# Patient Record
Sex: Female | Born: 1975
Health system: Western US, Academic
[De-identification: ages and names within clinical notes are randomized; demographics above are authoritative.]

---

## 2015-04-10 DIAGNOSIS — Z01419 Encounter for gynecological examination (general) (routine) without abnormal findings: Secondary | ICD-10-CM

## 2017-10-09 ENCOUNTER — Ambulatory Visit: Payer: PRIVATE HEALTH INSURANCE

## 2017-10-09 DIAGNOSIS — Z01419 Encounter for gynecological examination (general) (routine) without abnormal findings: Secondary | ICD-10-CM

## 2017-10-09 NOTE — Progress Notes
[facility] OB/[facility]N  New Gynecology/Annual Visit     PATIENT: Julie Armstrong  MRN: [id number]  DOB: November 24, 1976  DATE OF SERVICE: 10/09/2017    REFERRING PRACTITIONER: Harrietta Guardian., MD  PRIMARY CARE PROVIDER: Glendell Docker, MD    Chief Complaint:   Chief Complaint   Patient presents with   ??? Annual Exam     Follow up. Mammo due.       Subjective:      Julie Armstrong is a 41 y.o. [id number] who presents for well woman exam.  She is doing well and without complaints.     Review of Systems:  A 14-system review of systems was performed and is negative except as stated in the history of present illness.     Past Gynecologic History:  Menses qmonth  LPS   Results for orders placed or performed in visit on 04/10/15   Pap Smear   Result Value Ref Range    INTERPRETATION-RESULT Negative for Intraepithelial Lesion or Malignancy      SPECIMEN ADEQUACY       Satisfactory for evaluation, endocervical/transformation zone component present    Signatures       I certify that I personally conducted a gross and/or microscopic examination(s) of the described specimen(s), and have reviewed the interpretation of this test and diagnosis(es).  I agree with the documented findings or edited the findings as necessary.      CASE REPORT       Gynecologic Cytology Report                       Case: [id number]                                Authorizing Provider:  Harrietta Guardian., MD         Collected:           04/10/2015 1613              Ordering Location:     [facility]        Received:            04/11/2015 1540                                     [facility]                                                              First Screen:          Chryl Heck, CT(ASCP)                                                       Rescreen:              Phineas Semen, SCT(ASCP)  Specimen:    Cy Exo/Endo Smear Thin Prep-Screen, Exo/Endocervix HPV DNA PCR,Genital   Result Value Ref Range    HPV Type 16 Negative Negative for High Risk HPV DNA    HPV Type 18 Negative Negative for High Risk HPV DNA    Other High Risk HPV Negative Negative for High Risk HPV DNA    Narrative    Other High Risk HPV include: [id number] and 68.  Not tested for Low Risk HPV DNA.   Pap smear 30+ yo (with HPV), Thinprep    Narrative    The following orders were created for panel order Pap smear 30+ yo (with HPV), Thinprep.  Procedure                               Abnormality         Status                     ---------                               -----------         ------                     HPV DNA PCR,Genital[[id number]]          Normal              Final result               Pap [id number]]                                        Final result               Pap [id number]]                                        Final result                 Please view results for these tests on the individual orders.      No history of abnormal pap smear  No history of STD  Sexually active?: YES  Current Birth Control: fertility awareness    Past Obstetrical History:  [id number]    Past Medical History: No past medical history on file.  Past Surgical History:   Past Surgical History:   Procedure Laterality Date   ??? HIP SURGERY Right    ??? wisdom teeth         Medications:   No current outpatient prescriptions on file.     No current facility-administered medications for this visit.      Allergies: No Known Allergies    Social History:  Social History   Substance Use Topics   ??? Smoking status: Never Smoker   ??? Smokeless tobacco: Never Used   ??? Alcohol use No       Family History:   Family History   Problem Relation Age of Onset   ??? Hypertension Paternal Grandfather    ??? Hypertension Paternal Grandmother    ??? Diabetes Maternal Grandmother    ??? Hypertension Father    ??? Breast cancer Neg Hx    ???  Colon cancer Neg Hx          Objective:     Vitals:    10/09/17 0824 BP: (!) 154/101   Pulse: 75   Weight: 118 lb 3.2 oz (53.6 kg)   Height: 5' 4'' (1.626 m)     Body mass index is 20.29 kg/m???.    Gen: NAD  Neck: symmetrical, no masses, no thyroid enlargement  Breast: symmetrical soft, NT, no masses, no discharge  Lymph: no axillary lymphadenopathy, no neck lymphadenopathy  GI: soft, NT, ND, no evidence of hernia  Pelvic:   EGBUS: normal anatomy, no lesions, normal hair pattern  Urethral meatus: no masses, no tenderness, no prolapase  Bladder: no fullness, no suprapubic tenderness  Vagina: no discharge no lesions, normal estrogen effect  No signs of cystocele or rectocele  Cervix: no lesions, no active discharge, no CMT  BME: 8 weeks NT mobile no adnexal masses no adnexal tenderness  No groin lymphadenopathy  Perineum/anus: no hemorrhoids, no abrasions  Extremities: no edema no cords  Skin: no rashes no ulcers  Neuro: normal gait, A&O x 3  Psych: normal affect pleasant demeanor    Lab Review:    No visits with results within 2 Month(s) from this visit.   Latest known visit with results is:   Office Visit on 04/10/2015   Component Date Value   ??? HPV Type 16 04/10/2015 Negative    ??? HPV Type 18 04/10/2015 Negative    ??? Other High Risk HPV 04/10/2015 Negative    ??? INTERPRETATION-RESULT 04/10/2015 Negative for Intraepithelial Lesion or Malignancy    ??? SPECIMEN ADEQUACY 04/10/2015 Satisfactory for evaluation, endocervical/transformation zone component present    ??? Signatures 04/10/2015                      Value:This result contains rich text formatting which cannot be displayed here.   ??? CASE REPORT 04/10/2015                      Value:Gynecologic Cytology Report                       Case: [id number]                                Authorizing Provider:  Harrietta Guardian., MD         Collected:           04/10/2015 1613              Ordering Location:     [facility]        Received:            04/11/2015 1540 [facility]                                                              First Screen:          Chryl Heck, CT(ASCP)  Rescreen:              Phineas Semen, SCT(ASCP)                                                   Specimen:    Cy Exo/Endo Smear Thin Prep-Screen, Exo/Endocervix                                              Assessment:     41 y.o. [id number] presents for well woman exam    Plan/ Recommendation:   Problem List:  1. Well Woman Exam  - pap smear completed  - new pap smear guidelines have been discussed with the patient  - Mammogram ordered  2. STD counseling  - declined full STD screening  3. Contraception  - declined other options    Follow up in one year or prn    Author:  Maceo Pro. Shannan Harper 10/09/2017 8:33 AM

## 2017-10-12 LAB — HPV DNA PCR: HPV TYPE 18: NEGATIVE

## 2017-10-17 LAB — Liquid-based pap smear

## 2017-12-10 ENCOUNTER — Ambulatory Visit: Payer: PRIVATE HEALTH INSURANCE

## 2019-10-03 ENCOUNTER — Ambulatory Visit: Payer: PRIVATE HEALTH INSURANCE

## 2019-10-03 DIAGNOSIS — Z01419 Encounter for gynecological examination (general) (routine) without abnormal findings: Secondary | ICD-10-CM

## 2019-10-03 NOTE — Addendum Note
Addended by: Jerre Simon NEFTALI on: 10/03/2019 03:29 PM     Modules accepted: Orders

## 2019-10-03 NOTE — Progress Notes
[facility]  Gynecology/Annual Visit     PATIENT: Julie Armstrong  MRN: [id number]  DOB: 1976/04/24  DATE OF SERVICE: 10/03/2019    REFERRING PRACTITIONER: Harrietta Guardian., MD  PRIMARY CARE PROVIDER: Lovena Neighbours., MD    Chief Complaint:   Chief Complaint   Patient presents with   ? Annual Exam       Subjective:      Julie Armstrong is a 43 y.o. [id number] who presents for well woman exam.  She is doing well and without complaints.     Review of Systems:  A 14-system review of systems was performed and is negative except as stated in the history of present illness.     Past Gynecologic History:  Menses qmonth  LPS   Results for orders placed or performed in visit on 10/09/17   Pap Smear   Result Value Ref Range    CASE REPORT       Gynecologic Cytology Report                       Case: [id number]                                Authorizing Provider:  Harrietta Guardian., MD         Collected:           10/09/2017 1006              Ordering Location:     [facility]        Received:            10/12/2017 0925                                     [facility]                                                                First Screen:          Dory Peru, [place]                                                              Specimen:    Cy Exo/Endo Smear Sure Path-Screen, Exo/Endocervix                                         LMP 10/09/2017     INTERPRETATION-RESULT Negative for Intraepithelial Lesion or Malignancy      SPECIMEN ADEQUACY       Satisfactory for evaluation  Endocervical cells present    Signatures       I certify that I personally conducted a gross and/or microscopic examination(s) of the described specimen(s), and have reviewed the interpretation of this test and diagnosis(es).  I agree with the documented findings or edited the findings as necessary.  HPV DNA PCR,Genital   Result Value Ref Range    HPV Type 16 Negative Negative for High Risk HPV DNA HPV Type 18 Negative Negative for High Risk HPV DNA    Other High Risk HPV Negative Negative for High Risk HPV DNA    Narrative    Other High Risk HPV include: [phone] and 68.  Not tested for Low Risk HPV DNA.   Pap smear 30+ yo (with HPV), Surepath    Narrative    The following orders were created for panel order Pap smear 30+ yo (with HPV), Surepath.  Procedure                               Abnormality         Status                     ---------                               -----------         ------                     HPV DNA PCR,Genital[[id number]]          Normal              Final result               Pap [id number]]                                        Final result               Pap [id number]]                                        Final result                 Please view results for these tests on the individual orders.      No history of abnormal pap smear  No history of STD  Sexually active?: YES  Current Birth Control: fertility awareness    Past Obstetrical History:  [id number]    Past Medical History: No past medical history on file.  Past Surgical History:   Past Surgical History:   Procedure Laterality Date   ? HIP SURGERY Right    ? wisdom teeth         Medications:   No current outpatient medications on file.     No current facility-administered medications for this visit.      Allergies: No Known Allergies    Social History:  Social History     Tobacco Use   ? Smoking status: Never Smoker   ? Smokeless tobacco: Never Used   Substance Use Topics   ? Alcohol use: No     Alcohol/week: 0.0 oz   ? Drug use: No       Family History:   Family History   Problem Relation Age of Onset   ? Hypertension Paternal Grandfather    ? Hypertension Paternal Grandmother    ? Diabetes Maternal Grandmother    ? Hypertension Father    ?  Breast cancer Neg Hx    ? Colon cancer Neg Hx          Objective:     Vitals:    10/03/19 1452   BP: 131/72   Pulse: 86   Temp: 36 ?C (96.8 ?F) TempSrc: Temporal   Weight: 117 lb (53.1 kg)   Height: 5' 4'' (1.626 m)     Body mass index is 20.08 kg/m?.    Gen: NAD  Neck: symmetrical, no masses, no thyroid enlargement  Breast: symmetrical soft, NT, no masses, no discharge  Lymph: no axillary lymphadenopathy, no neck lymphadenopathy  GI: soft, NT, ND, no evidence of hernia  Pelvic:   EGBUS: normal anatomy, no lesions, normal hair pattern  Urethral meatus: no masses, no tenderness, no prolapase  Bladder: no fullness, no suprapubic tenderness  Vagina: no discharge no lesions, normal estrogen effect  No signs of cystocele or rectocele  Cervix: no lesions, no active discharge, no CMT  BME: 8 weeks NT mobile no adnexal masses no adnexal tenderness  No groin lymphadenopathy  Perineum/anus: no hemorrhoids, no abrasions  Extremities: no edema no cords  Skin: no rashes no ulcers  Neuro: normal gait, A&O x 3  Psych: normal affect pleasant demeanor    Lab Review:    No visits with results within 2 Month(s) from this visit.   Latest known visit with results is:   Office Visit on 10/09/2017   Component Date Value   ? HPV Type 16 10/09/2017 Negative    ? HPV Type 18 10/09/2017 Negative    ? Other High Risk HPV 10/09/2017 Negative    ? CASE REPORT 10/09/2017                      Value:Gynecologic Cytology Report                       Case: [id number]                                Authorizing Provider:  Harrietta Guardian., MD         Collected:           10/09/2017 1006              Ordering Location:     [facility]       Received:            10/12/2017 0925                                     [facility]                                                                First Screen:          Dory Peru, [place]  Specimen:    Cy Exo/Endo Smear Sure Path-Screen, Exo/Endocervix                                        ? LMP 10/09/2017 10/09/2017 ? INTERPRETATION-RESULT 10/09/2017 Negative for Intraepithelial Lesion or Malignancy    ? SPECIMEN ADEQUACY 10/09/2017 Satisfactory for evaluation  Endocervical cells present    ? Signatures 10/09/2017                      Value:This result contains rich text formatting which cannot be displayed here.         Assessment:     43 y.o. [id number] presents for well woman exam    Plan/ Recommendation:   Problem List:  1. Well Woman Exam  - pap smear completed  - new pap smear guidelines have been discussed with the patient  - Mammogram ordered  2. STD counseling  - declined full STD screening  3. Contraception  - declined other options    Follow up in one year or prn    Author:  Maceo Pro. Gudelia Eugene 10/03/2019 3:06 PM

## 2019-10-06 LAB — HPV DNA PCR: OTHER HIGH RISK HPV: NEGATIVE

## 2019-10-14 LAB — Liquid-based pap smear

## 2019-11-17 ENCOUNTER — Ambulatory Visit: Payer: PRIVATE HEALTH INSURANCE

## 2022-01-06 ENCOUNTER — Telehealth: Payer: PRIVATE HEALTH INSURANCE

## 2022-01-06 DIAGNOSIS — Z1231 Encounter for screening mammogram for malignant neoplasm of breast: Secondary | ICD-10-CM

## 2022-01-06 NOTE — Telephone Encounter
Orders faxed

## 2022-01-06 NOTE — Telephone Encounter
Orders Request    What is being requested? (Tests, Labs, Imaging, etc.): Mammo screening order    Reason for the request: annual. Please also send to patient on mychart.     Where does the patient want to be seen?    [facility]   If outside [facility], what is the fax number to the facility?  Fax:[phone]    Has the patient seen their doctor for this matter?   yes  Last office visit: 10/03/19    Patient or caller was offered an appointment but declined.    Patient or caller has been notified of the turnaround time of 1-2 business day(s).

## 2022-02-21 ENCOUNTER — Telehealth: Payer: PRIVATE HEALTH INSURANCE

## 2022-02-21 NOTE — Telephone Encounter
Pt informed. Will watch for the month and let us know if persists.

## 2022-02-21 NOTE — Telephone Encounter
Call Back Request      Reason for call back:     Pt noticed a small lump on right breast. Mammogram was done 01/14/22, normal results. Pt is on her 4th day of her cycle. Pls call back to advised. If no answer pls leave a msg     Any Symptoms:  [x]  Yes  []  No       If yes, what symptoms are you experiencing:  Small right breast lump   o Duration of symptoms (how long):  4 day   o Have you taken medication for symptoms (OTC or Rx):  no    If call was taken outside of clinic hours:    [] Patient or caller has been notified that this message was sent outside of normal clinic hours.     [] Patient or caller has been warm transferred to the physician's answering service. If applicable, patient or caller informed to please call us back if symptoms progress.  Patient or caller has been notified of the turnaround time of 1-2 business day(s).

## 2022-07-22 ENCOUNTER — Ambulatory Visit: Payer: PRIVATE HEALTH INSURANCE

## 2022-07-22 NOTE — Progress Notes
[facility] OB/GYN  Gynecology/Annual Visit     PATIENT: Julie Armstrong  MRN: [id number]  DOB: Dec 21, 1975  DATE OF SERVICE: 07/22/2022    REFERRING PRACTITIONER: Referral, Self  PRIMARY CARE PROVIDER: Diona Foley, MD    Chief Complaint:   Chief Complaint   Patient presents with   ? Annual Exam       Subjective:      Julie Armstrong is a 46 y.o. [id number] who presents for well woman exam.  Having insomnia and slight changes in menses.  Density D on mammogram.  She is doing well and without complaints.     Review of Systems:  A 14-system review of systems was performed and is negative except as stated in the history of present illness.     Past Gynecologic History:  Menses qmonth  LPS   Results for orders placed or performed in visit on 10/03/19   Pap Smear   Result Value Ref Range    CASE REPORT       Gynecologic Cytology Report                       Case: [id number]                                Authorizing Provider:  Harrietta Guardian., MD         Collected:           10/03/2019 1529              Ordering Location:     [facility]       Received:            10/04/2019 1347                                     [facility]                                                                First Screen:          Janifer Adie, CT(ASCP)                                                         Specimen:    Cy Exo/Endo Smear Sure Path-Screen, Exo/Endocervix                                         LMP 09/28/2019     INTERPRETATION-RESULT Negative for Intraepithelial Lesion or Malignancy.      SPECIMEN ADEQUACY       Satisfactory for evaluation.  Endocervical cells present.    Signatures       I certify that I personally conducted a gross and/or microscopic examination(s) of the described specimen(s), and have reviewed the interpretation of this test and diagnosis(es).  I agree with the documented  findings or edited the findings as necessary.     HPV DNA PCR,Genital    Specimen: Exo/Endocervix; Genital Cytology Result Value Ref Range    HPV Type 16 Negative Negative for High Risk HPV DNA    HPV Type 18 Negative Negative for High Risk HPV DNA    Other High Risk HPV Negative Negative for High Risk HPV DNA    Narrative    Other High Risk HPV include: [phone] and 68.  Not tested for Low Risk HPV DNA.   Pap smear 30+ yo (with HPV), [facility]    Narrative    The following orders were created for panel order Pap smear 30+ yo (with HPV), [facility].  Procedure                               Abnormality         Status                     ---------                               -----------         ------                     HPV DNA PCR,Genital[[id number]]          Normal              Final result               Pap [id number]]                                        Final result               Pap [id number][[id number]]                                        Final result                 Please view results for these tests on the individual orders.      No history of abnormal pap smear  No history of STD  Sexually active?: YES  Current Birth Control: fertility awareness    Past Obstetrical History:  [id number]    Past Medical History: No past medical history on file.  Past Surgical History:   Past Surgical History:   Procedure Laterality Date   ? HIP SURGERY Right    ? wisdom teeth         Medications:   No current outpatient medications on file.     No current facility-administered medications for this visit.     Allergies:   Allergies   Allergen Reactions   ? Penicillins Nausea And Vomiting       Social History:  Social History     Tobacco Use   ? Smoking status: Never   ? Smokeless tobacco: Never   Substance Use Topics   ? Alcohol use: No     Alcohol/week: 0.0 oz   ? Drug use: No       Family History:   Family History   Problem Relation Age of  Onset   ? Hypertension Father    ? Cancer Father         prostate   ? Hypertension Paternal Grandfather    ? Hypertension Paternal Grandmother    ? Diabetes Maternal Grandmother    ? Breast cancer Neg Hx    ? Colon cancer Neg Hx          Objective:     Vitals:    07/22/22 0840   BP: 115/66   Pulse: 83   Weight: 122 lb (55.3 kg)   Height: 5' 4'' (1.626 m)     Body mass index is 20.94 kg/m?.    Gen: NAD  Neck: symmetrical, no masses, no thyroid enlargement  Breast: symmetrical soft, NT, no masses, no discharge  Lymph: no axillary lymphadenopathy, no neck lymphadenopathy  GI: soft, NT, ND, no evidence of hernia  Pelvic:   EGBUS: normal anatomy, no lesions, normal hair pattern  Urethral meatus: no masses, no tenderness, no prolapase  Bladder: no fullness, no suprapubic tenderness  Vagina: no discharge no lesions, normal estrogen effect  No signs of cystocele or rectocele  Cervix: no lesions, no active discharge, no CMT  BME: 8 weeks NT mobile no adnexal masses no adnexal tenderness  No groin lymphadenopathy  Perineum/anus: no hemorrhoids, no abrasions  Extremities: no edema no cords  Skin: no rashes no ulcers  Neuro: normal gait, A&O x 3  Psych: normal affect pleasant demeanor    Lab Review:    No visits with results within 2 Month(s) from this visit.   Latest known visit with results is:   Office Visit on 10/03/2019   Component Date Value   ? HPV Type 16 10/03/2019 Negative    ? HPV Type 18 10/03/2019 Negative    ? Other High Risk HPV 10/03/2019 Negative    ? CASE REPORT 10/03/2019                      Value:Gynecologic Cytology Report                       Case: [id number]                                Authorizing Provider:  Harrietta Guardian., MD         Collected:           10/03/2019 1529              Ordering Location:     [facility]       Received:            10/04/2019 1347                                     [facility]                                                                First Screen:          Janifer Adie, CT(ASCP)  Specimen:    Cy Exo/Endo Smear Sure Path-Screen, Exo/Endocervix ? LMP 10/03/2019 09/28/2019    ? INTERPRETATION-RESULT 10/03/2019 Negative for Intraepithelial Lesion or Malignancy.    ? SPECIMEN ADEQUACY 10/03/2019 Satisfactory for evaluation.  Endocervical cells present.    ? Signatures 10/03/2019                      Value:This result contains rich text formatting which cannot be displayed here.         Assessment:     46 y.o. [id number] presents for well woman exam    Plan/ Recommendation:   Problem List:  1. Well Woman Exam  - pap smear completed  - new pap smear guidelines have been discussed with the patient  - Mammogram ordered  2. STD counseling  - declined full STD screening  3. Contraception  - declined other options    Follow up in one year or prn    Author:  Maceo Pro. Tessi Eustache 07/22/2022 8:45 AM

## 2022-07-24 LAB — HPV DNA PCR: HPV TYPE 16: NEGATIVE

## 2022-08-02 LAB — Liquid-based pap smear

## 2022-08-25 IMAGING — CR calc dir
2 series · 2 of 2 positions shown · non-contrast
Comparison: none

[axial (1 of 2)]
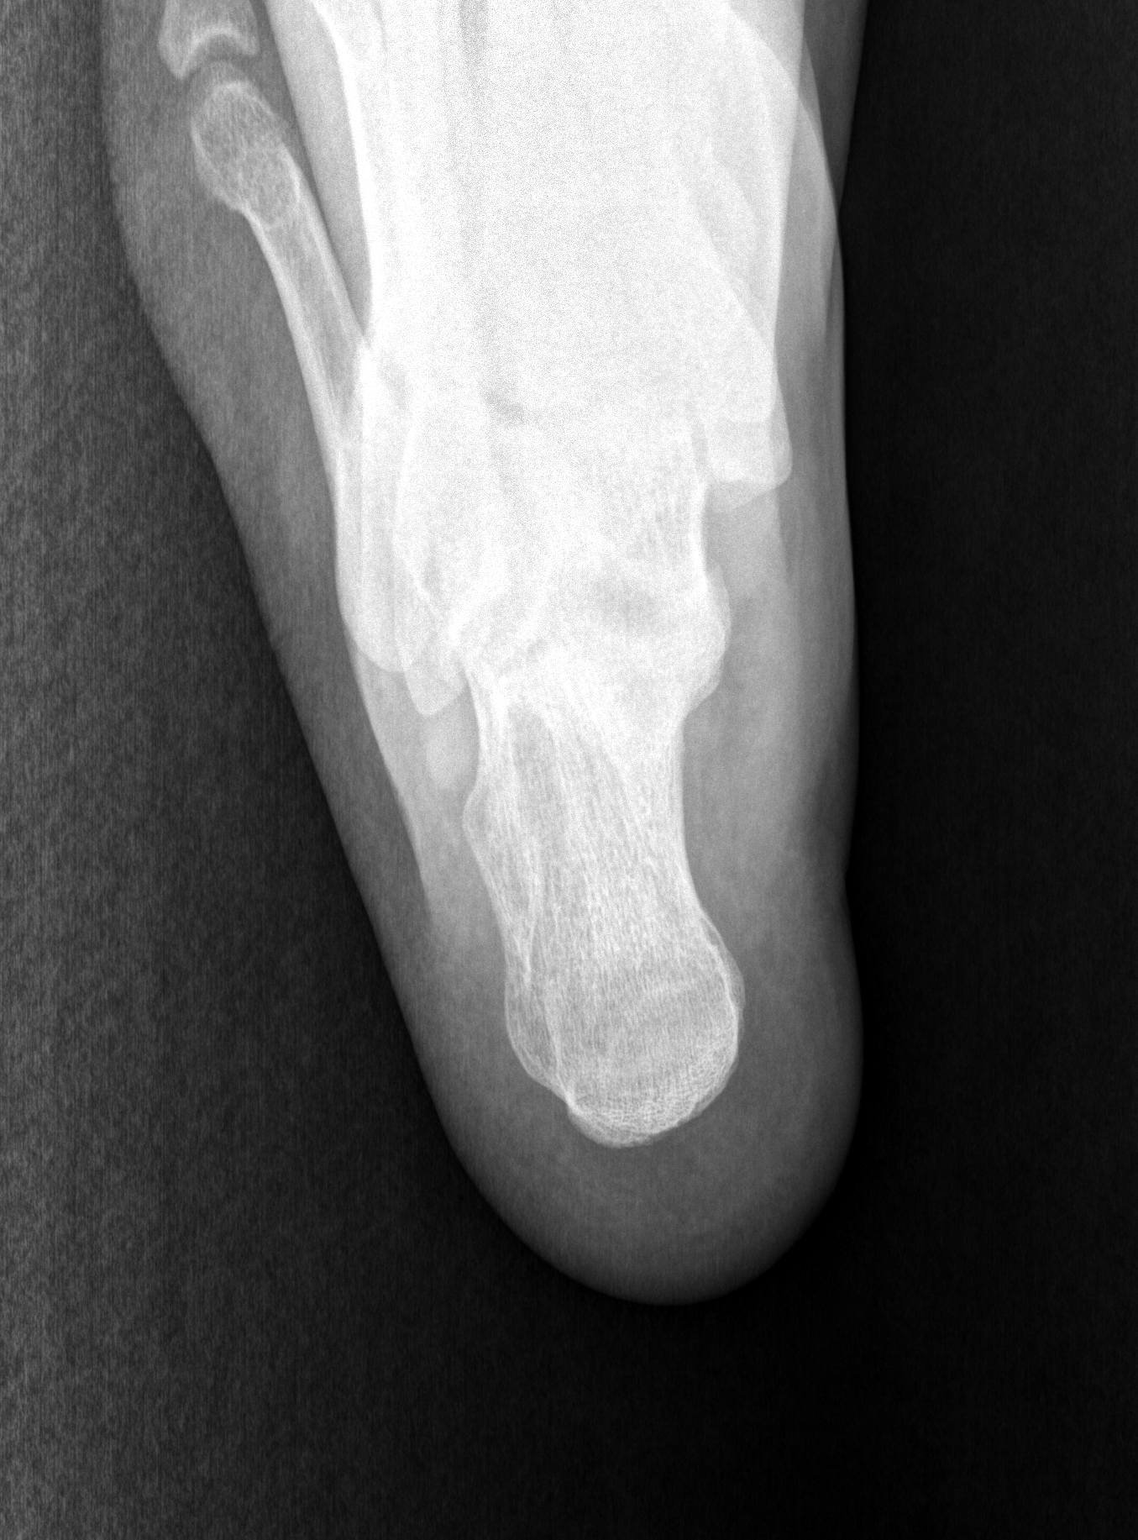

[axial (2 of 2)]
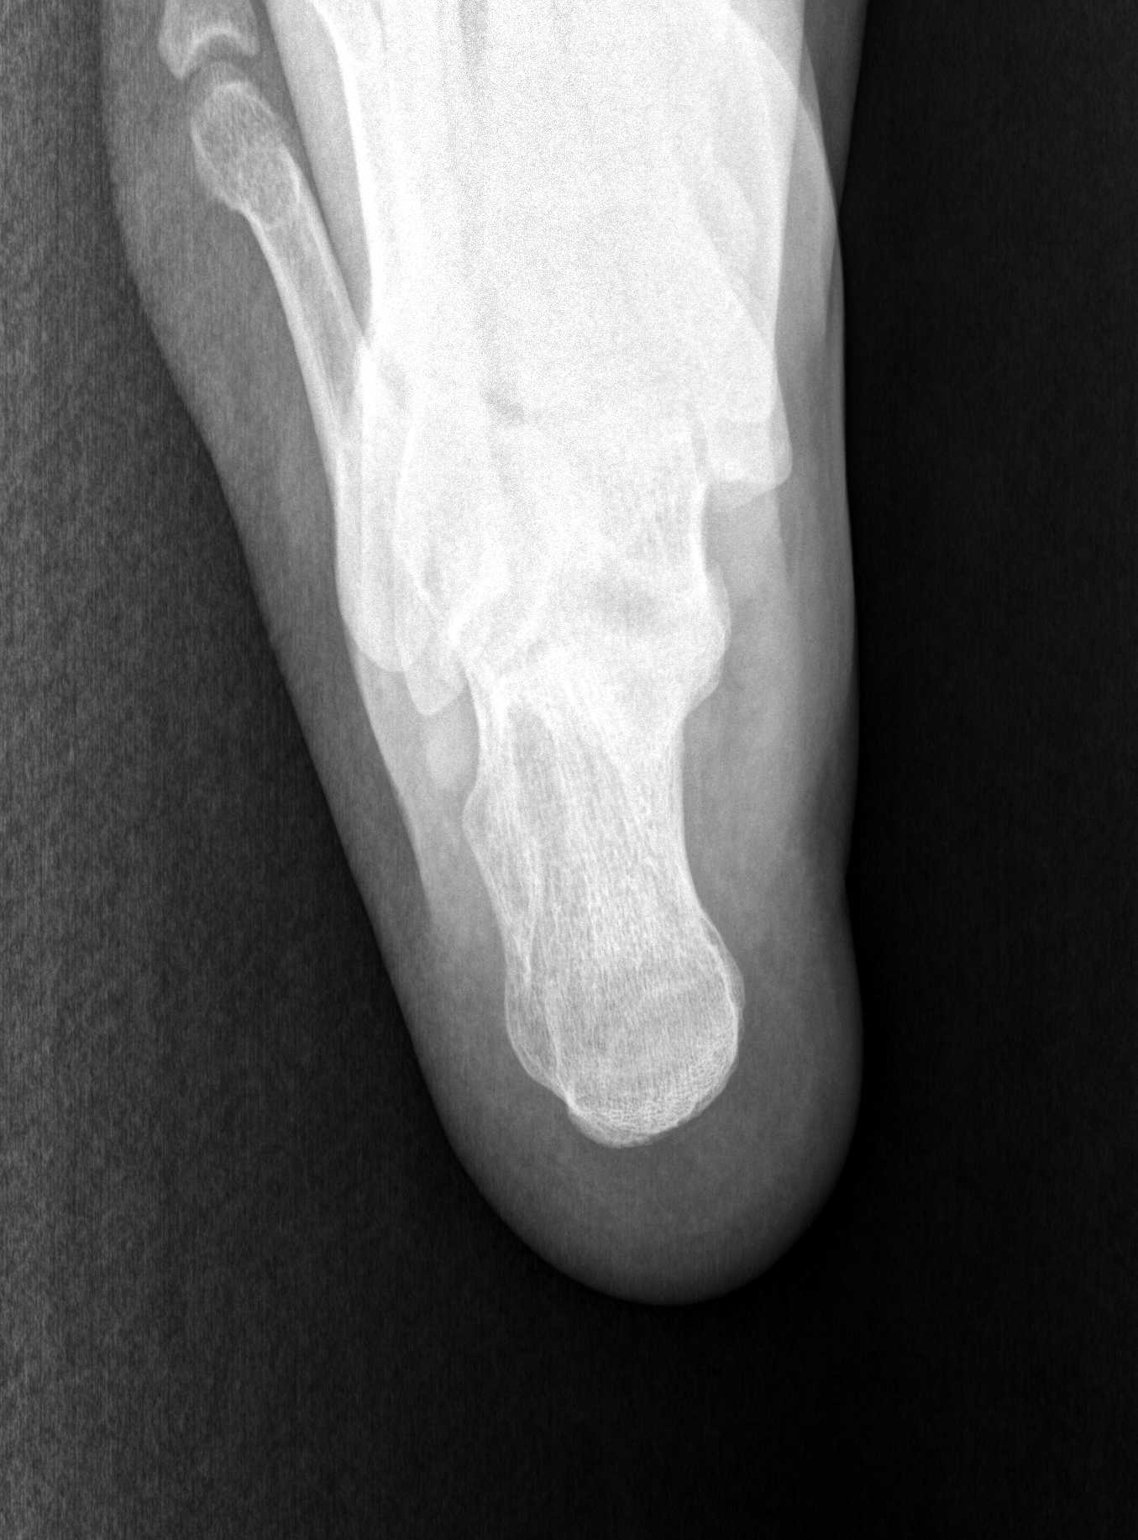

[2 of 2 positions shown; findings below may reference images not displayed]

Relatório:
Textura óssea normal.
Estruturas ósseas alinhadas.
RADIOGRAFIA DIGITAL DO CALCÂNEO DIREITO AXIAL /
Partes moles visíveis sem alterações.

## 2022-08-25 IMAGING — CR pe dir
6 series · 6 of 6 positions shown · non-contrast
Comparison: none

[ap (1 of 2)]
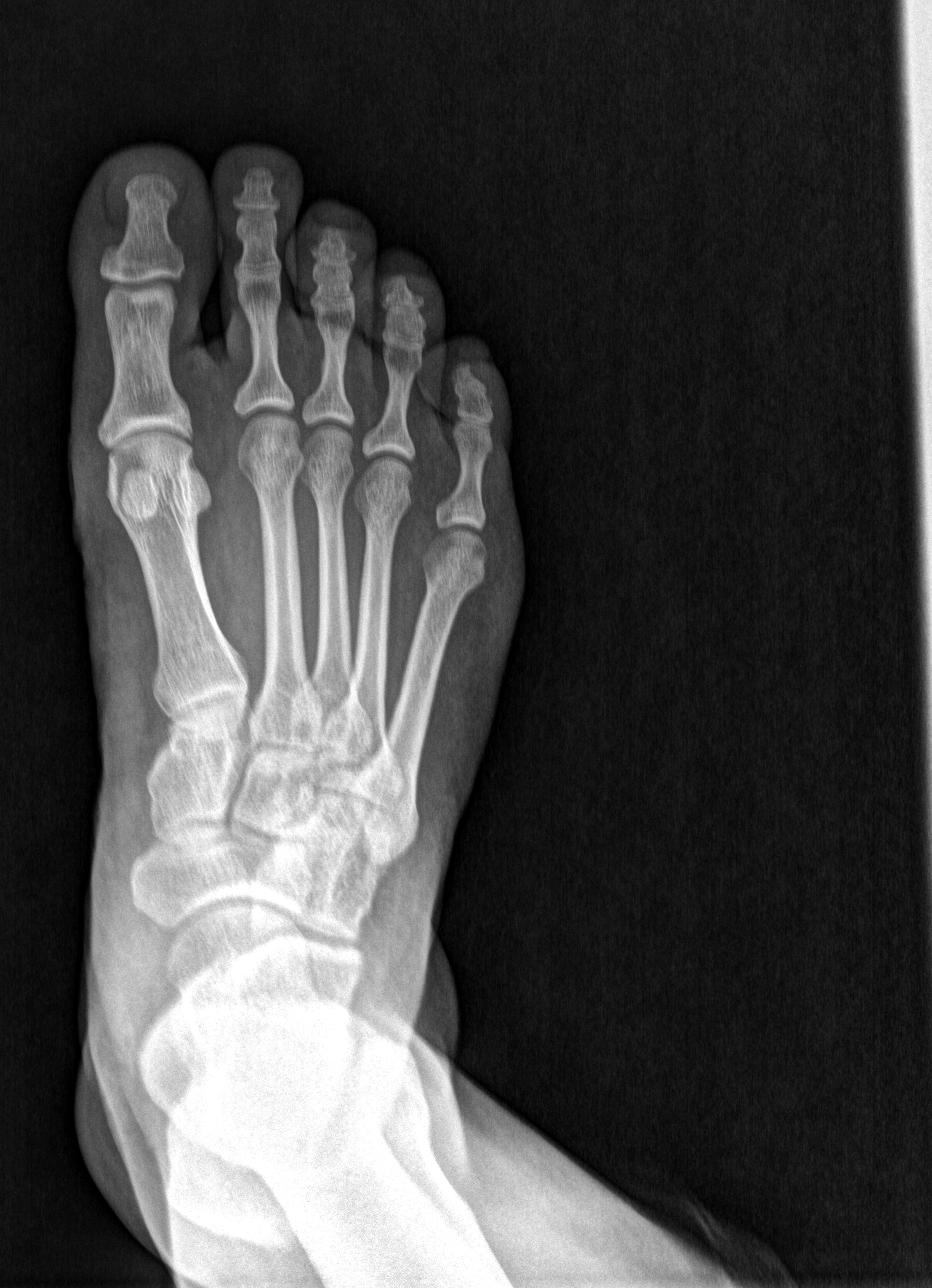

[oblíqua (1 of 2)]
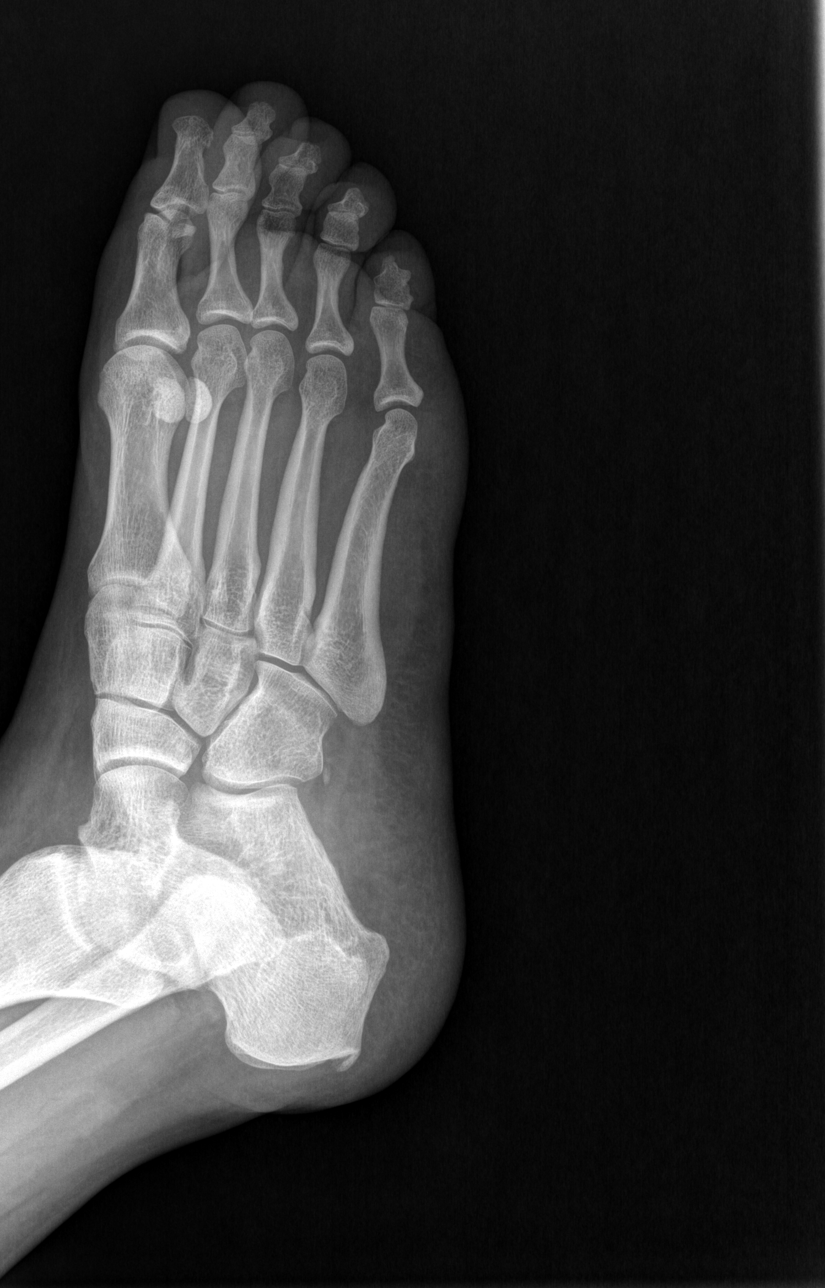

[lateral (1 of 2)]
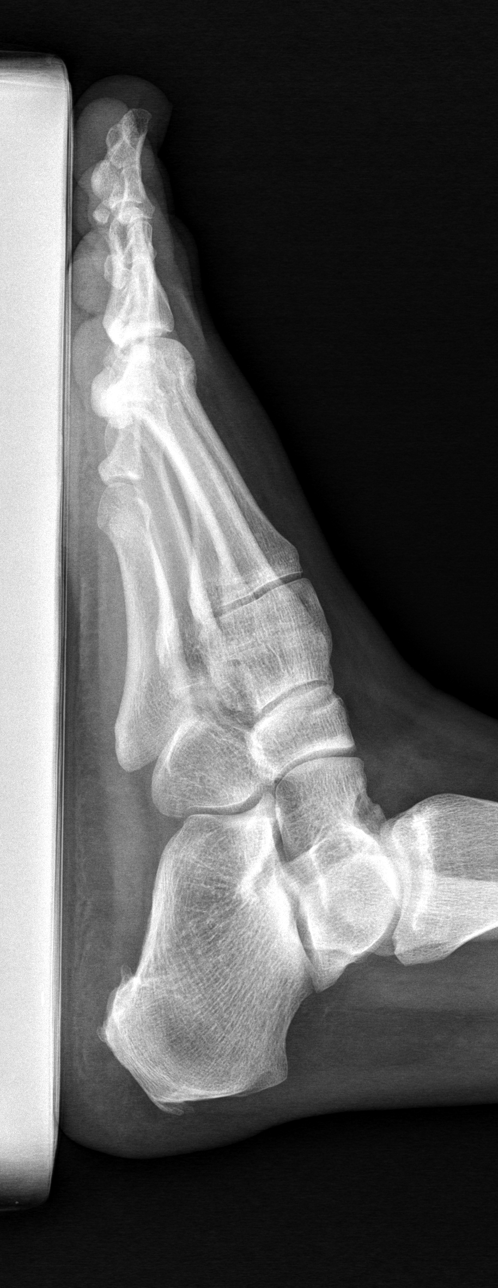

[ap (2 of 2)]
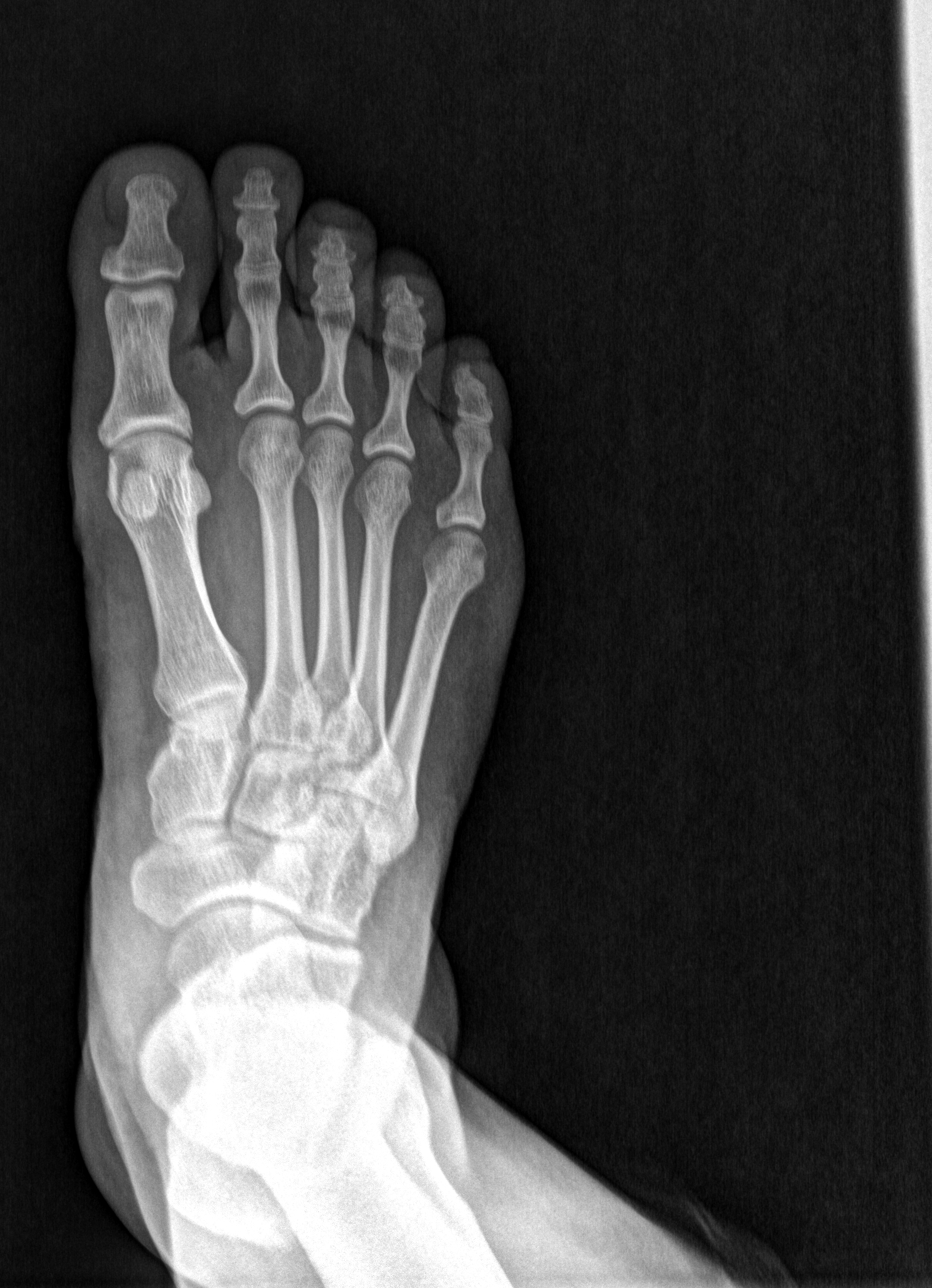

[oblíqua (2 of 2)]
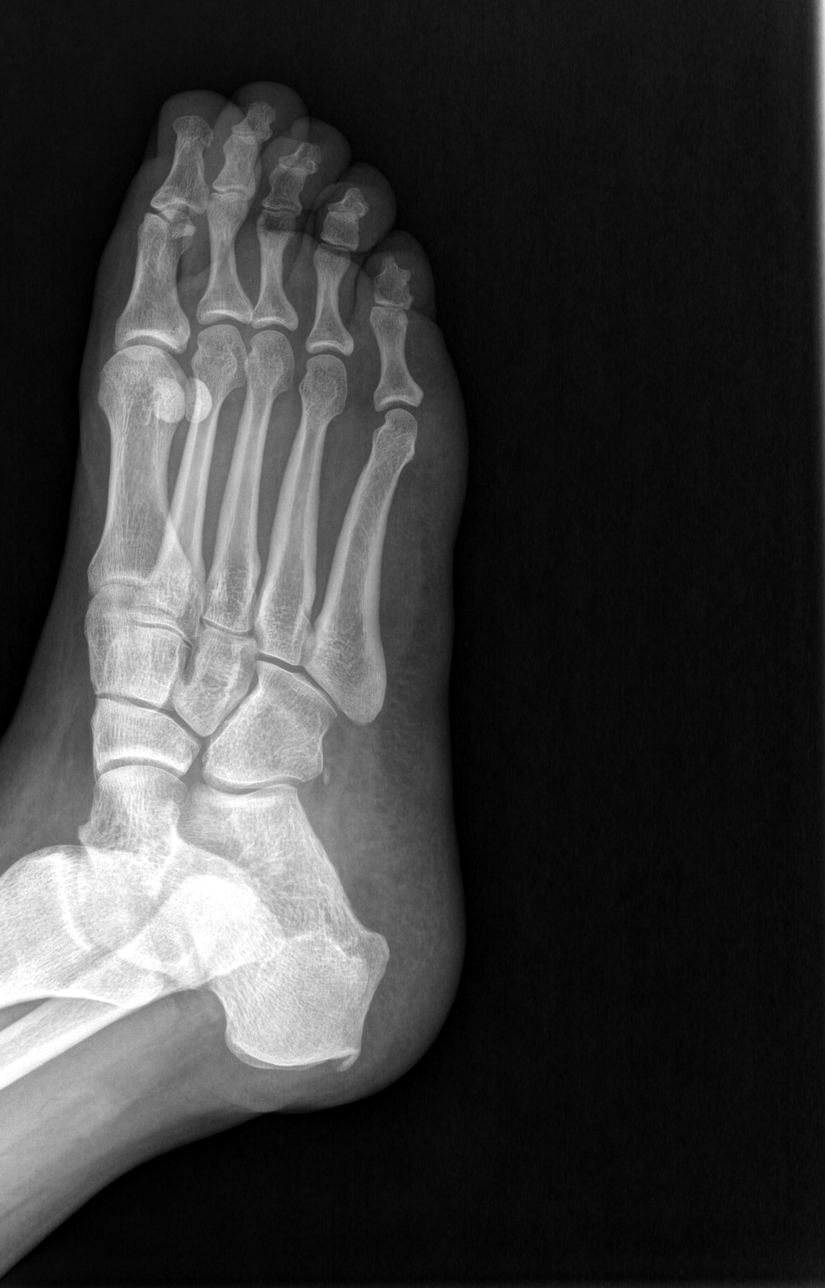

[lateral (2 of 2)]
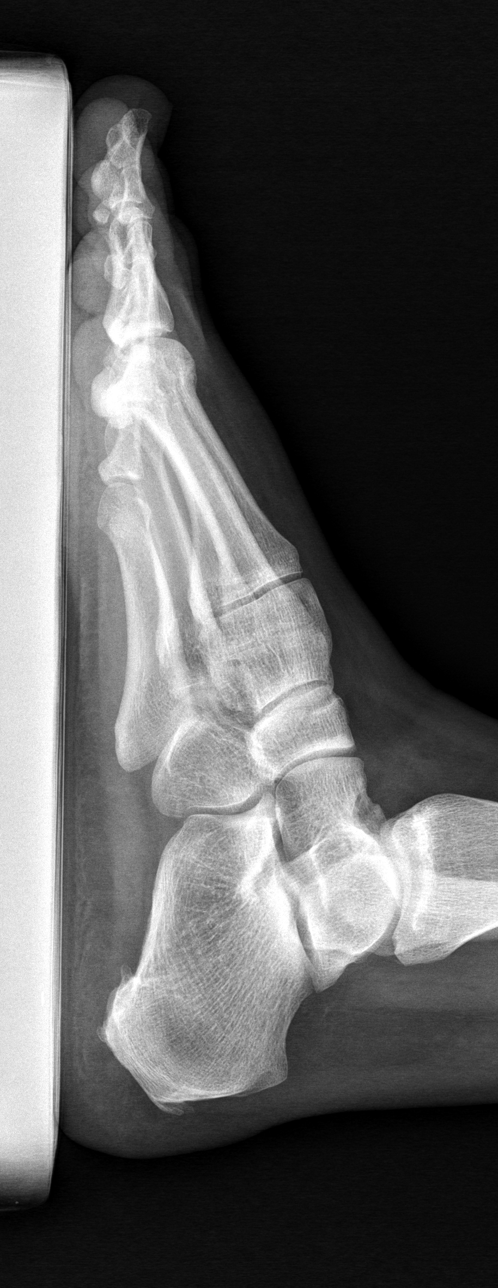

[6 of 6 positions shown; findings below may reference images not displayed]

RADIOGRAFIA DIGITAL     PÉ DIREITO    AP E PERFIL COM APOIO / OBLIQUA
Morfologia óssea normal.
Espaços articulares conservados.
Estruturas ósseas alinhadas.
"Esporões" do calcâneo Dir/

## 2022-08-25 IMAGING — MR [HOSPITAL]^TORNOZELO
6 series · 40 of 40 positions shown · non-contrast
Comparison: none

[Series 3: T1 · sagittal · left · 3.5mm · 0.44mm/px · 6 of 17 slices shown]
[im 1/17]
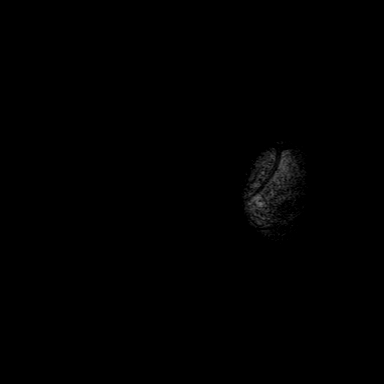
[im 4/17]
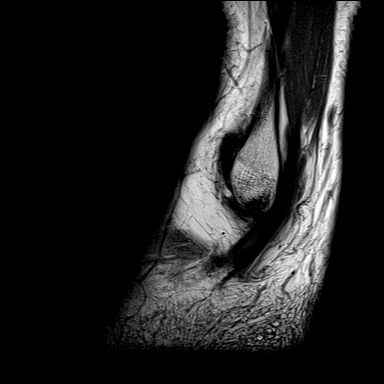
[im 7/17]
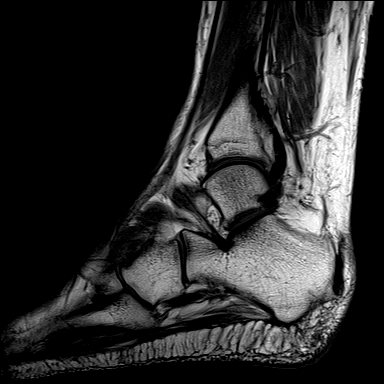
[im 10/17]
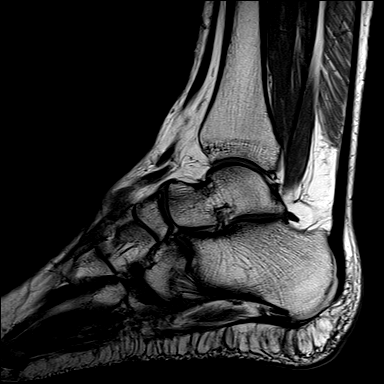
[im 13/17]
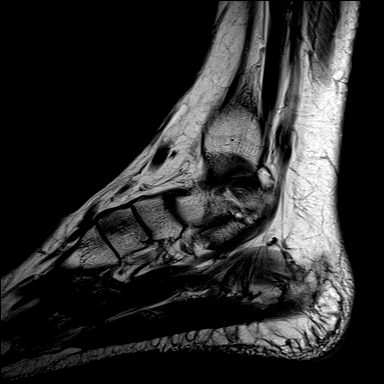
[im 17/17]
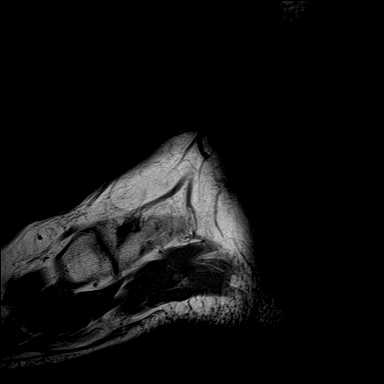

[Series 4: sagital dp fat · sagittal · left · 3.5mm · 0.66mm/px · 5 of 17 slices shown]
[im 1/17]
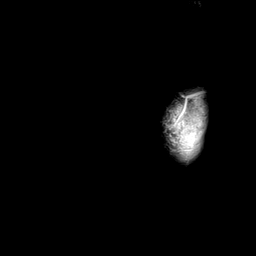
[im 5/17]
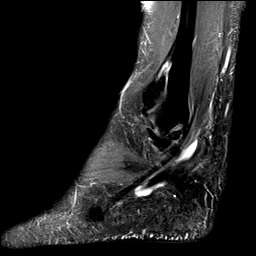
[im 9/17]
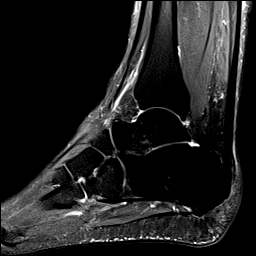
[im 13/17]
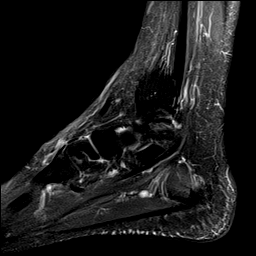
[im 17/17]
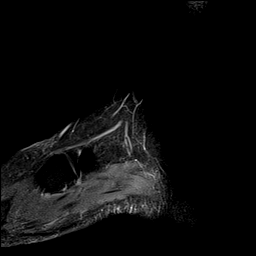

[Series 5: axial dp fat · axial · left · 3.5mm · 0.66mm/px · z∈[-76,+31]mm · 7 of 24 slices shown]
[im 1/24]
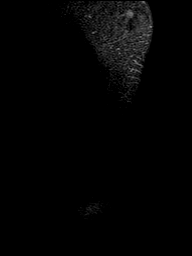
[im 4/24]
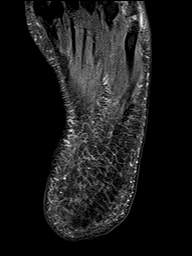
[im 8/24]
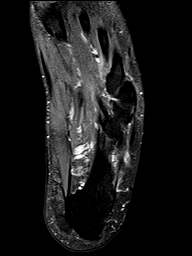
[im 12/24]
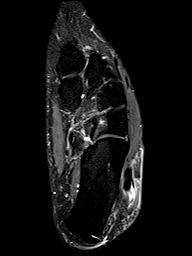
[im 16/24]
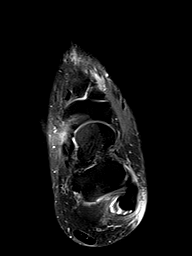
[im 20/24]
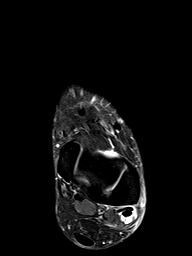
[im 24/24]
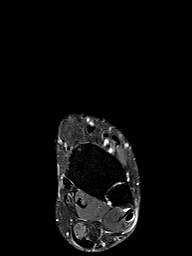

[Series 6: axial dp · axial · left · 3.5mm · 0.66mm/px · z∈[-76,+31]mm · 7 of 24 slices shown]
[im 1/24]
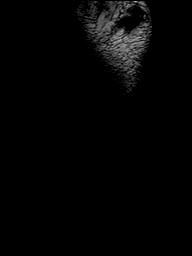
[im 4/24]
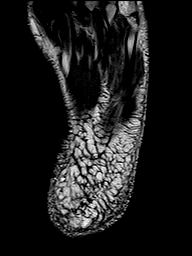
[im 8/24]
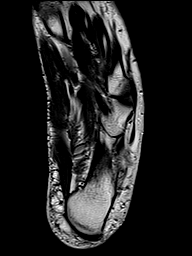
[im 12/24]
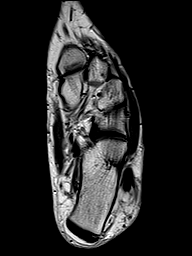
[im 16/24]
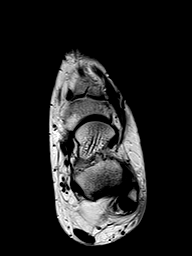
[im 20/24]
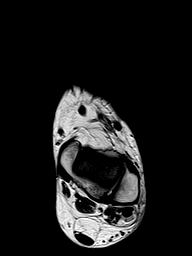
[im 24/24]
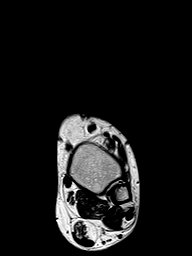

[Series 7: coronal dp fat · coronal · left · 4.0mm · 0.53mm/px · 8 of 28 slices shown (1 of 2)]
[im 1/28]
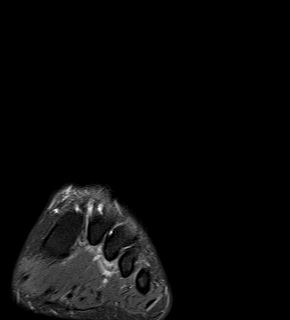
[im 4/28]
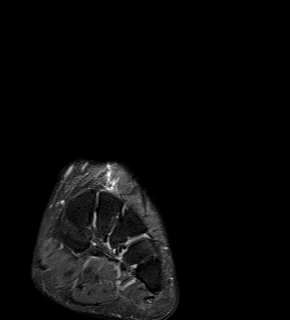
[im 8/28]
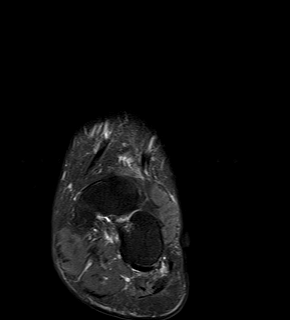
[im 12/28]
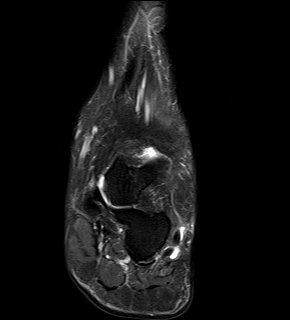
[im 16/28]
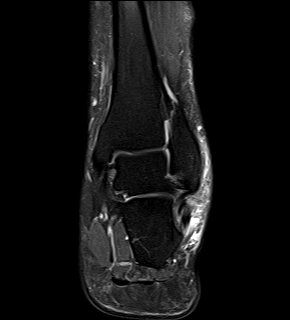
[im 20/28]
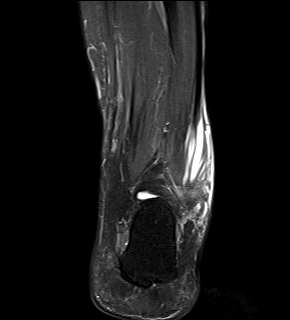
[im 24/28]
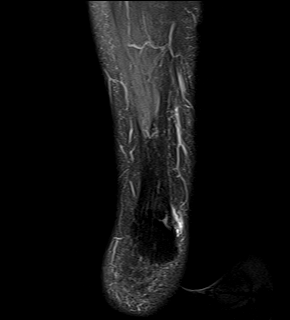
[im 28/28]
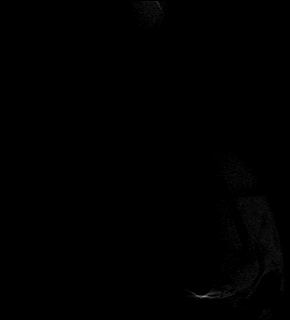

[Series 8: coronal dp fat · oblique · left · 3.5mm · 0.53mm/px · 7 of 22 slices shown (2 of 2)]
[im 1/22]
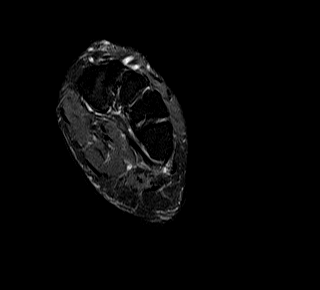
[im 4/22]
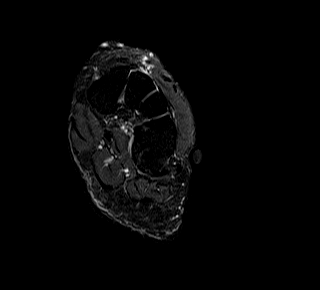
[im 8/22]
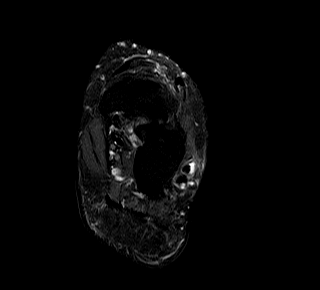
[im 11/22]
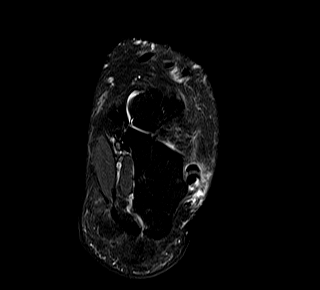
[im 15/22]
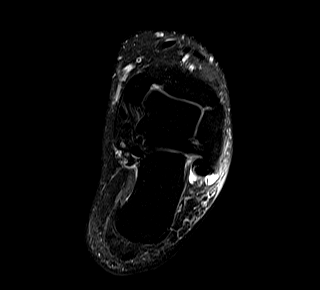
[im 18/22]
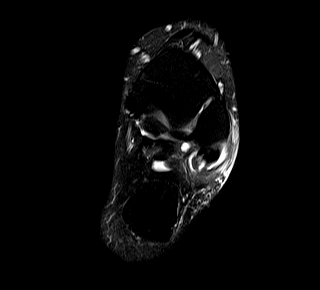
[im 22/22]
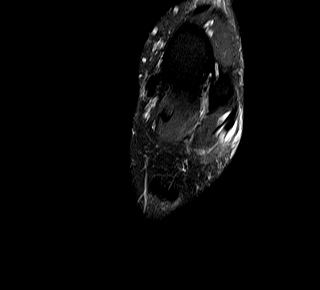

[40 of 40 positions shown; findings below may reference images not displayed]

Técnica:
Foram realizadas sequências multiplanares com ponderações em T1 e T2/DP, com e sem supressão do sinal
da gordura.
Relatório:
Sinais de angulação súpero-lateral do tálus em relação ao calcâneo, notando-se leve acentuação
RESSONÂNCIA MAGNÉTICA DO TORNOZELO ESQUERDO
do arco plantar, o que pode estar relacionado a componente de pé cavo varo.
Tendinopatia e tenossinovite dos fibulares, notando-se ruptura longitudinal no trajeto do retro e
inframaleolar do fibular curto, com interposição parcial do fibular longo, configurando lesão com
morfologia do tipo Split. Há importante distensão da bainha sinovial e edema peritendíneo.
Fasciopatia plantar, com espessamento e irregularidades das fibras insercionais da fáscia, notando-se leve
edema adjacente.
Pequeno foco de edema ósseo subcortical dorsal no cuboide, de aspecto inespecífico.
Moderado edema circunjacente ao ligamento fibulotalar anterior, sem descontinuidades evidentes no
presente estudo (estiramento?).
Restante da estrutura óssea visibilizada preservada.
A superfície articular do domus talar não apresenta anormalidades.
Ligamentos fibulotalar posterior, fibulocalcâneo, colaterais mediais (deltoide) e porções visibilizadas do
complexo sindesmótico sem descontinuidades.
Tendões flexores e extensores sem evidências de anormalidades.
Tendão calcâneo com morfologia e intensidade de sinal usuais.
Ausência de derrame articular.

## 2022-08-25 IMAGING — CR calc esq
2 series · 2 of 2 positions shown · non-contrast
Comparison: none

[axial (1 of 2)]
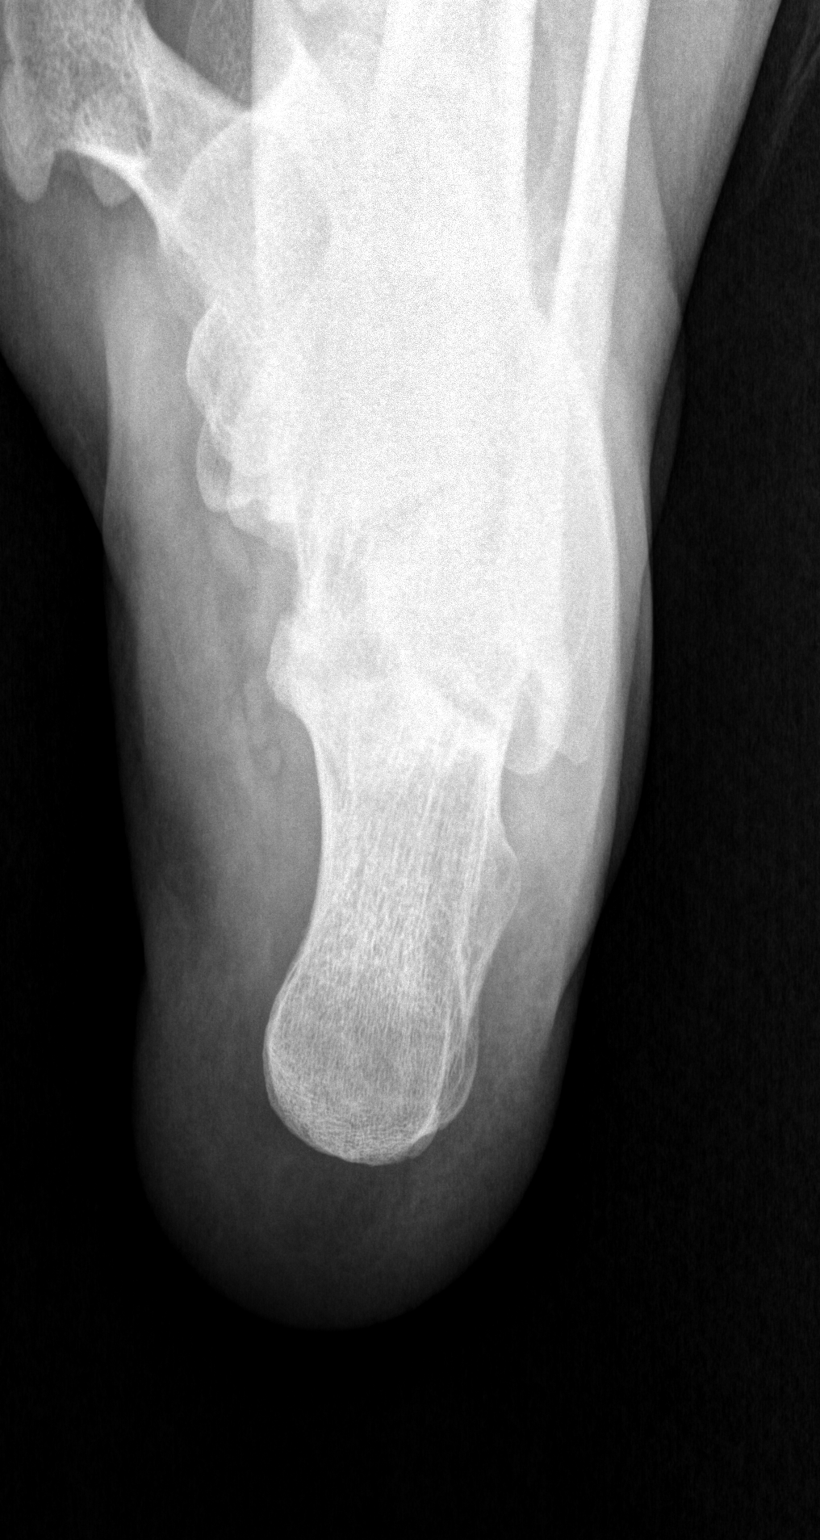

[axial (2 of 2)]
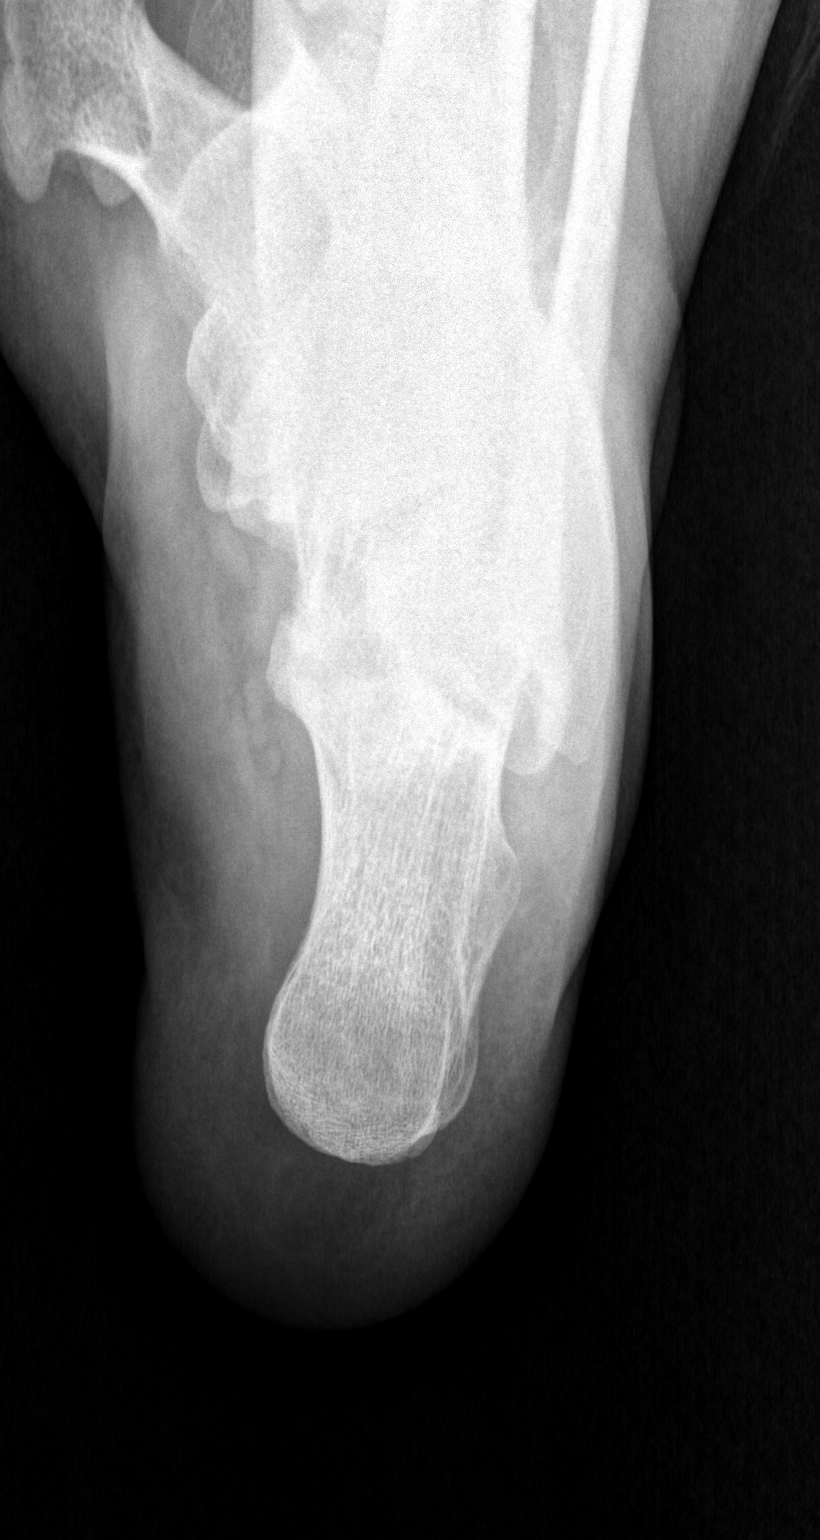

[2 of 2 positions shown; findings below may reference images not displayed]

Relatório:
RADIOGRAFIA DIGITAL DO CALCÂNEO ESQUERDO AXIAL E PERFIL
Textura óssea normal.
Espaços e superfícies articulares preservados.
Estruturas ósseas alinhadas.
Pequenos "esporões" do calcâneo Esq/
Partes moles visíveis sem alterações.

## 2022-08-25 IMAGING — CR pe esq
6 series · 6 of 6 positions shown · non-contrast
Comparison: none

[ap (1 of 2)]
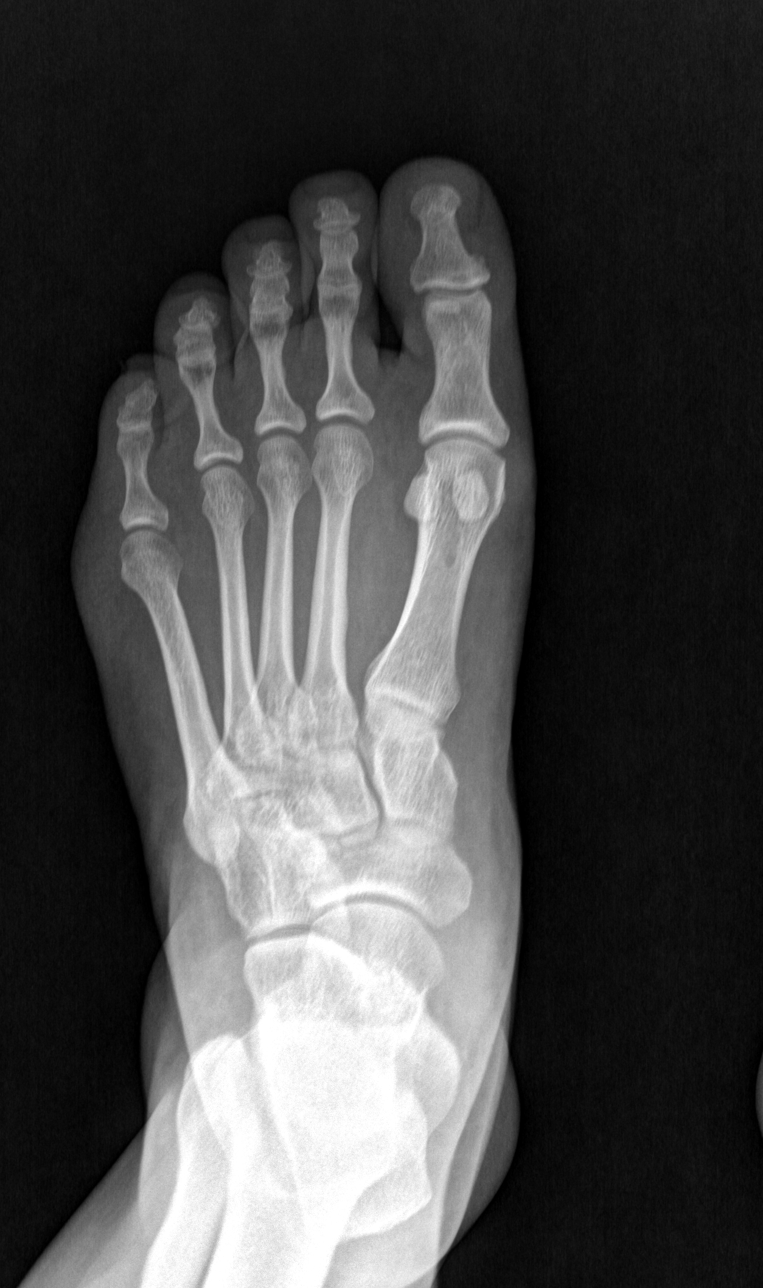

[oblíqua (1 of 2)]
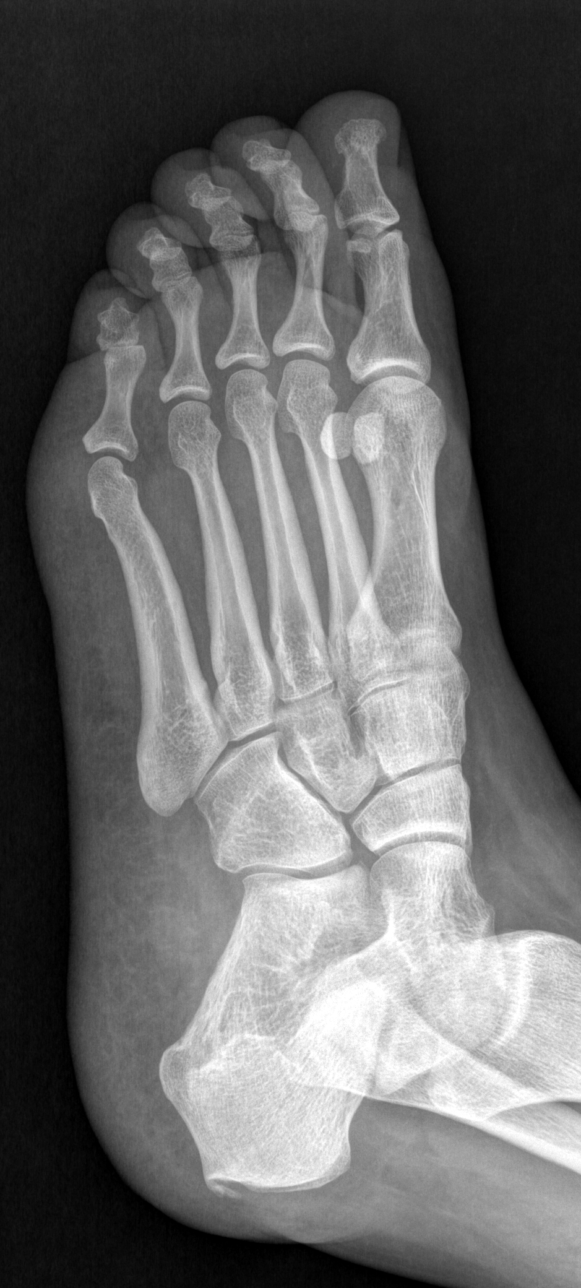

[lateral (1 of 2)]
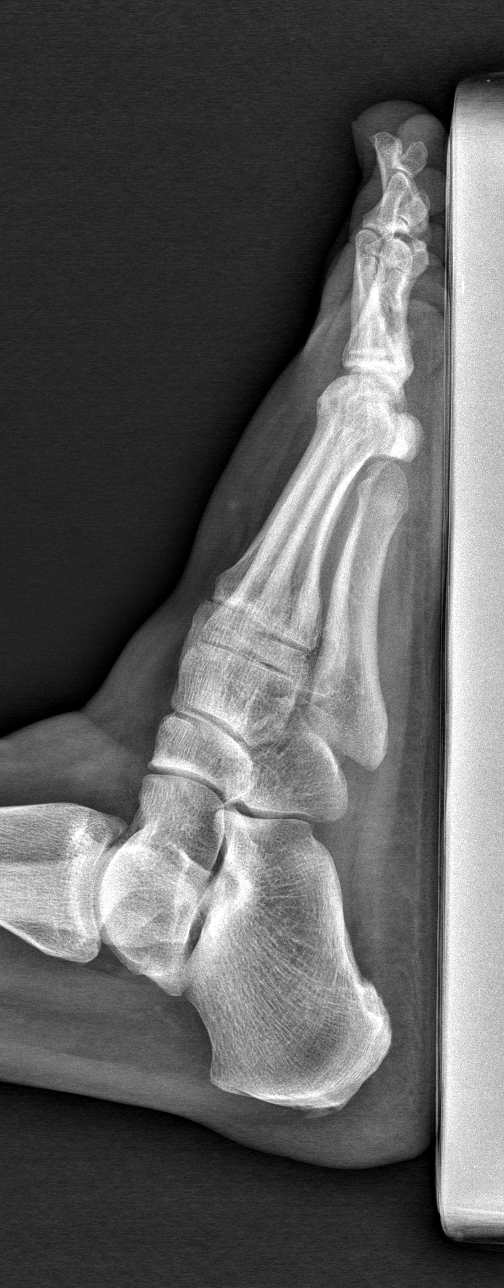

[ap (2 of 2)]
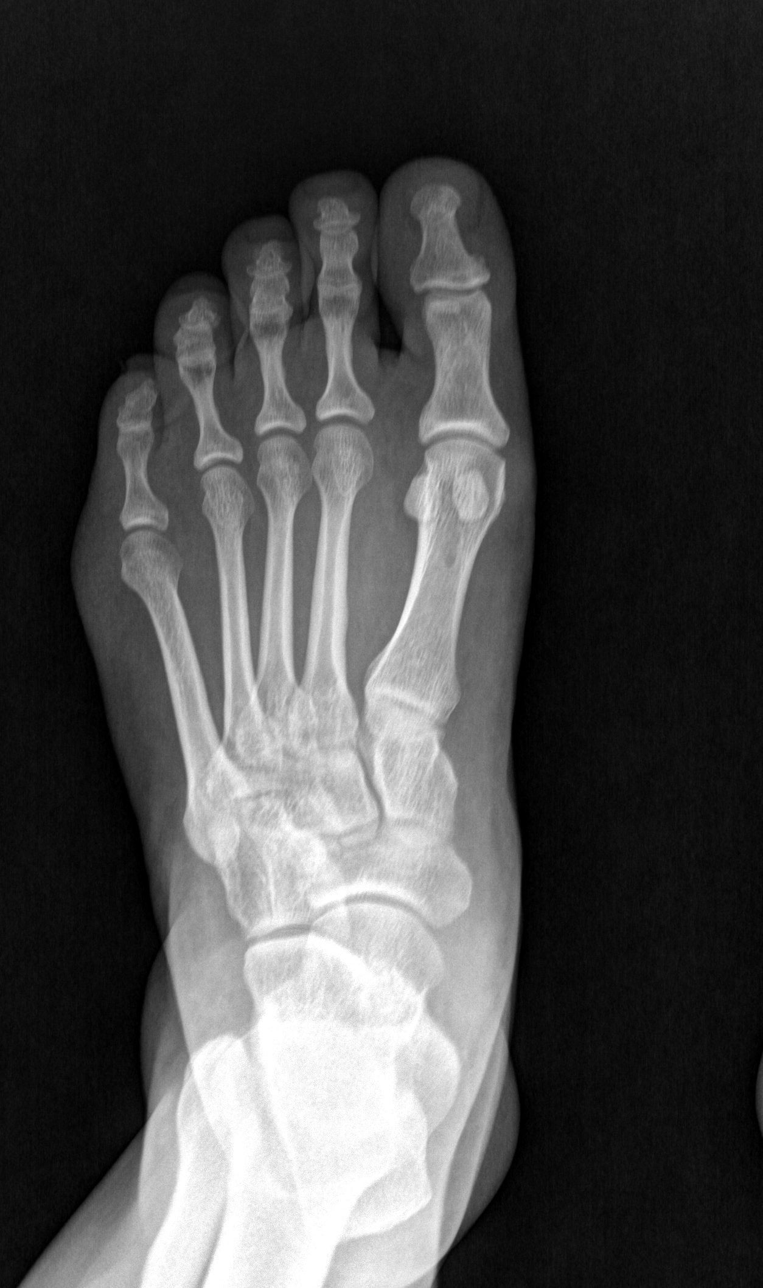

[oblíqua (2 of 2)]
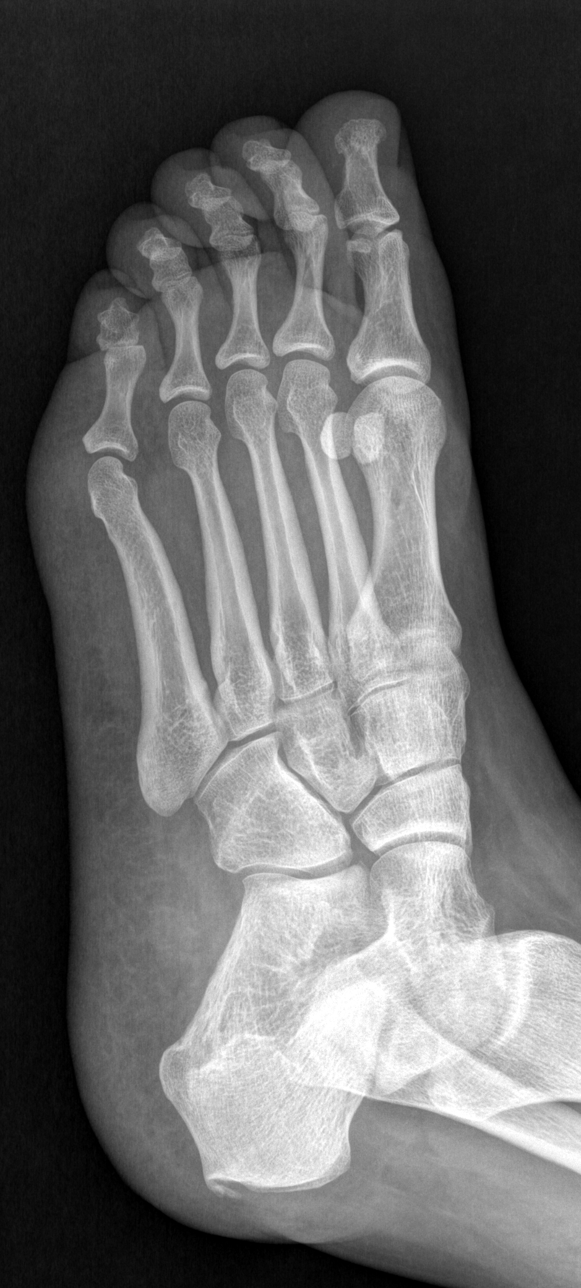

[lateral (2 of 2)]
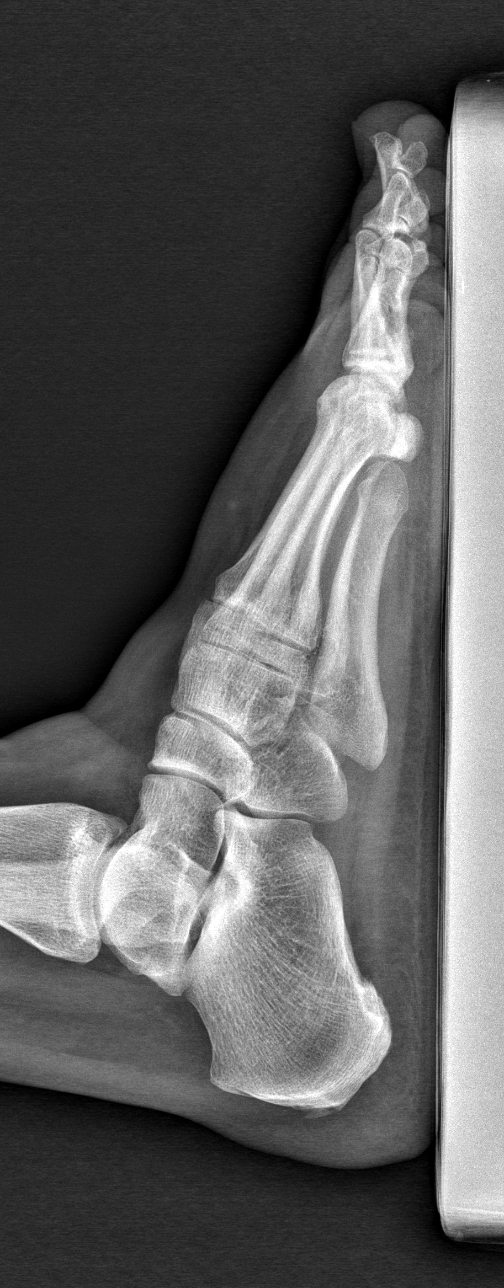

[6 of 6 positions shown; findings below may reference images not displayed]

RADIOGRAFIA DIGITAL     PÉ ESQUERDO  AP E PERFIL COM APOIO / OBLIQUA

Morfologia óssea normal. 
Espaços articulares conservados. 
Estruturas ósseas alinhadas.
Pequenos "esporões" do calcâneo Esq/

## 2023-10-09 IMAGING — MR [HOSPITAL]^[facility]
5 series · 40 of 40 positions shown · non-contrast
Comparison: none

[Series 3: T1 · axial · left · 3.2mm · 0.41mm/px · z∈[-84,+47]mm · 9 of 33 slices shown (1 of 2)]
[im 1/33]
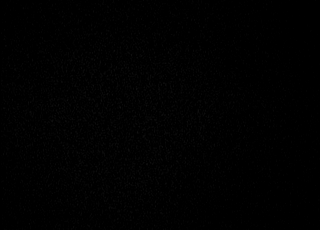
[im 5/33]
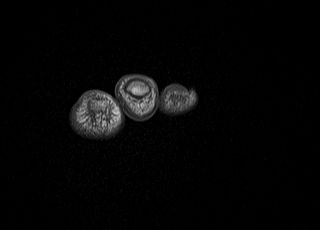
[im 9/33]
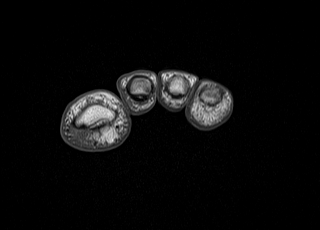
[im 13/33]
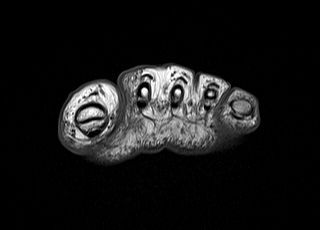
[im 17/33]
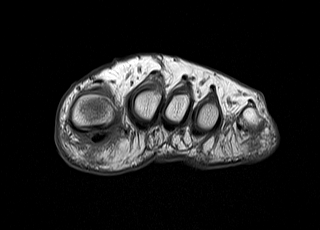
[im 21/33]
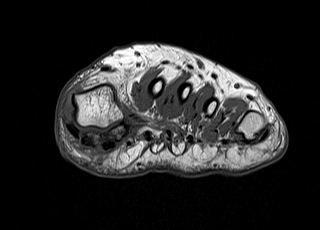
[im 25/33]
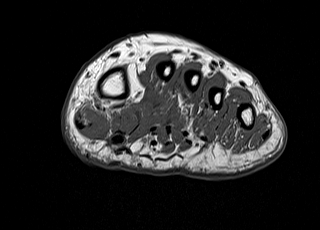
[im 29/33]
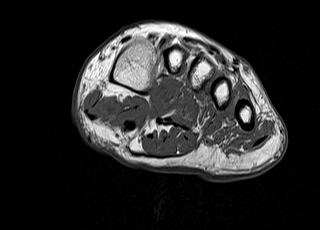
[im 33/33]
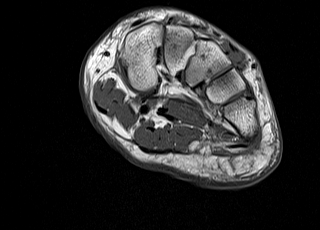

[Series 4: axial dp fat · axial · left · 3.2mm · 0.41mm/px · z∈[-84,+46]mm · 10 of 33 slices shown]
[im 1/33]
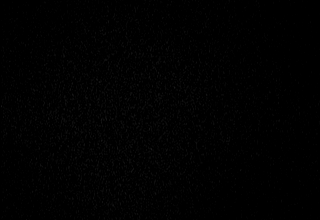
[im 4/33]
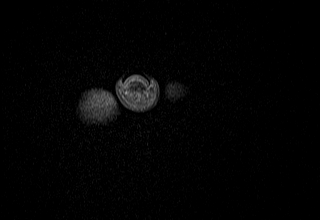
[im 8/33]
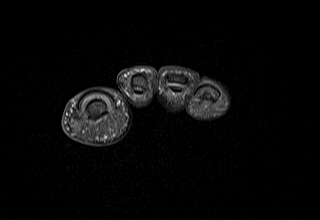
[im 11/33]
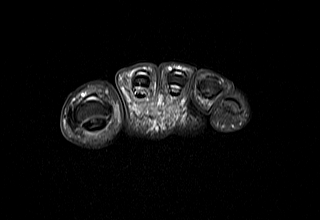
[im 15/33]
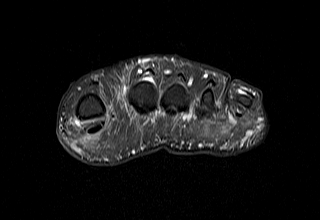
[im 18/33]
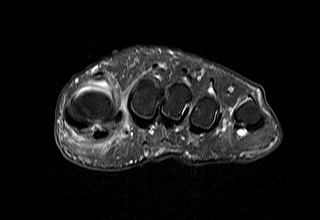
[im 22/33]
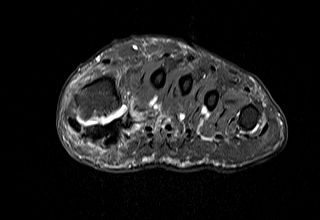
[im 25/33]
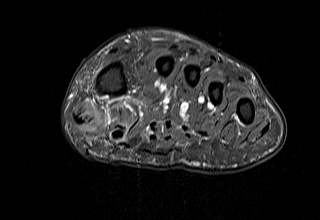
[im 29/33]
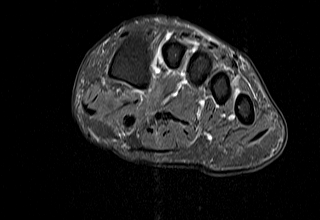
[im 33/33]
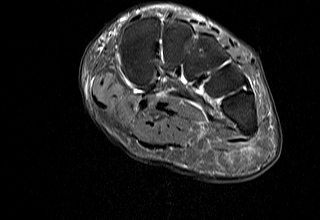

[Series 5: STIR · coronal · left · 3.2mm · 0.62mm/px · 7 of 22 slices shown]
[im 1/22]
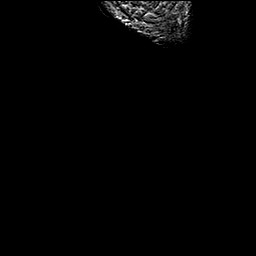
[im 4/22]
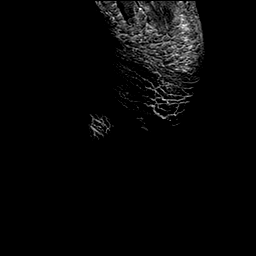
[im 8/22]
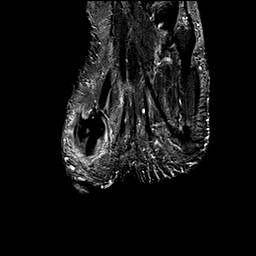
[im 11/22]
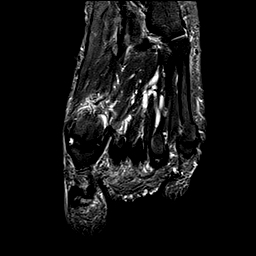
[im 15/22]
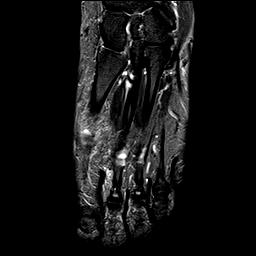
[im 18/22]
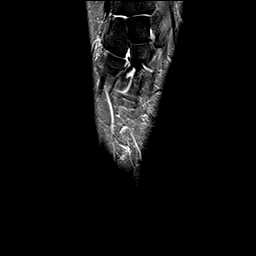
[im 22/22]
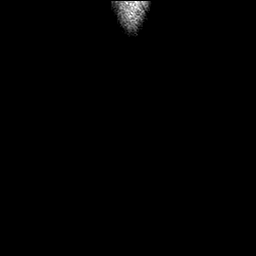

[Series 6: T1 · coronal · left · 3.2mm · 0.42mm/px · 7 of 22 slices shown (2 of 2)]
[im 1/22]
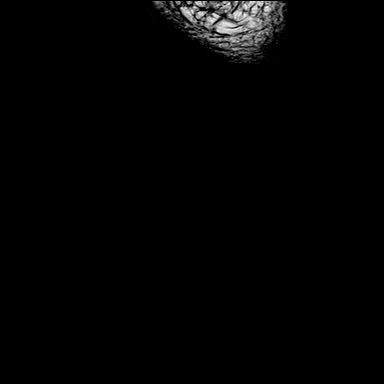
[im 4/22]
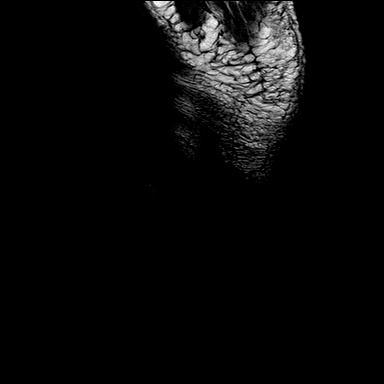
[im 8/22]
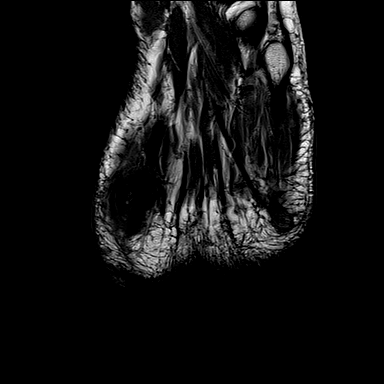
[im 11/22]
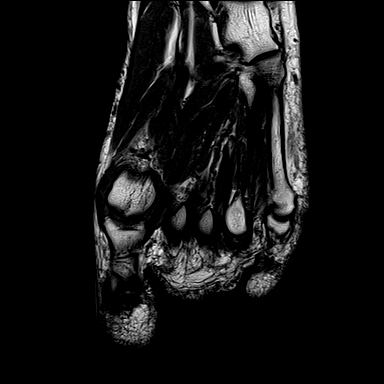
[im 15/22]
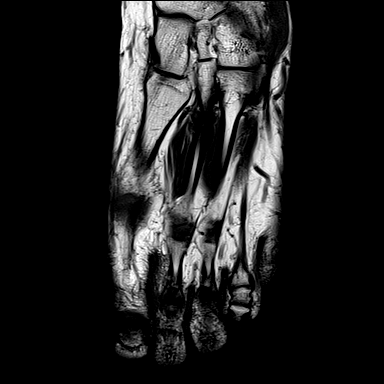
[im 18/22]
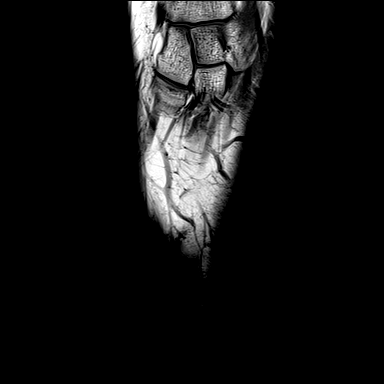
[im 22/22]
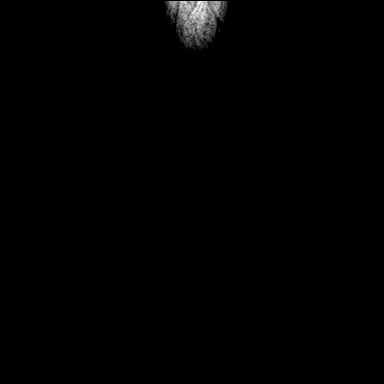

[Series 7: sagital dp fat · oblique · left · 3.2mm · 0.41mm/px · 7 of 23 slices shown]
[im 1/23]
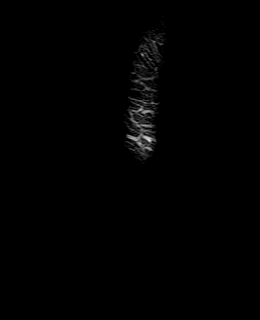
[im 4/23]
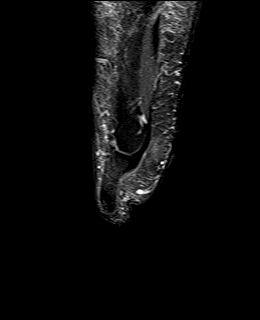
[im 8/23]
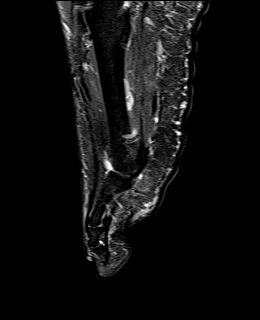
[im 12/23]
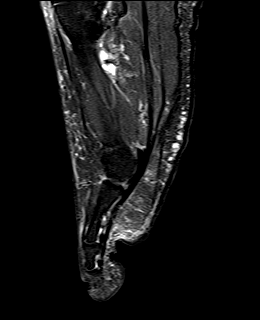
[im 15/23]
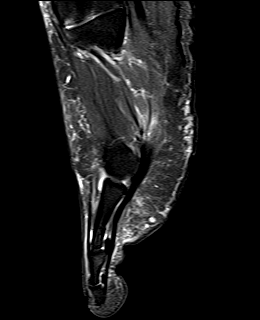
[im 19/23]
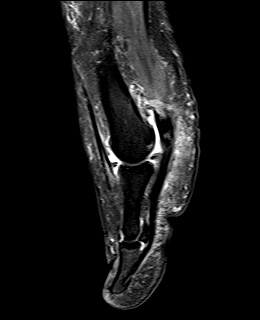
[im 23/23]
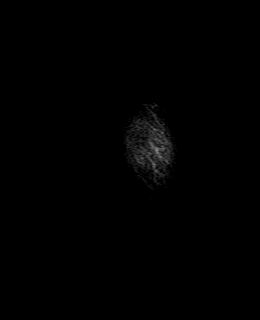

[40 of 40 positions shown; findings below may reference images not displayed]

Técnica:
Foram realizadas imagens sem o uso do contraste venoso.
Relatório:
Relações articulares conservadas.
Focos de edema na medula óssea dos sesamoides do hálux, indicando sesamoidite.
RESSONÂNCIA MAGNÉTICA DO PÉ ESQUERDO
Foco de edema do osso subcondral na cabeça do primeiro metatarso.
Derrame articular metatarsofalangeano do hálux, com edema dos planos gordurosos ao redor da articulação,
sugerindo sinovite.
Focos de sinal degenerativo junto à inserção das placas plantares do segundo e terceiro dedos, sem
destacamento evidente.
Restante das estruturas ósseas com sinal normal.
Estruturas ligamentares íntegras.
Demais estruturas musculotendinosas conservadas.
Não observamos imagens características de neuromas de Morton.
Impressão:
Afilamento condral com edema subcondral no complexo glenossesamoide do hálux.
Derrame articular metatarsofalangeano do hálux, com edema dos planos gordurosos ao redor da articulação,
sugerindo sinovite.
Lâmina líquida da bainha do tendão flexor longo do hálux, por tenossinovite.
Este laudo retifica e torna sem valor o liberado anteriormente conforme definição do item [IP_ADDRESS] da [facility] [id number] ANVISA.

## 2023-12-17 IMAGING — MR [HOSPITAL]^[facility]
6 series · 40 of 40 positions shown · non-contrast
Comparison: none

[Series 3: T1 · sagittal · left · 3.5mm · 0.44mm/px · 6 of 18 slices shown]
[im 1/18]
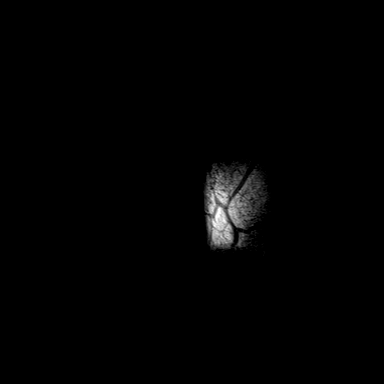
[im 4/18]
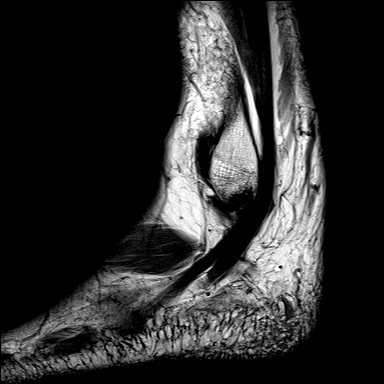
[im 7/18]
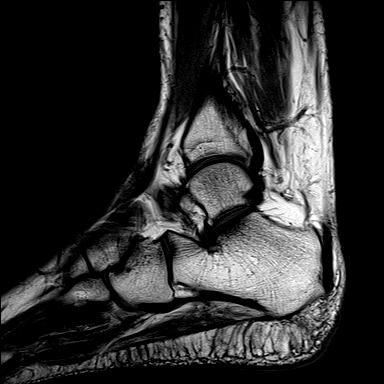
[im 11/18]
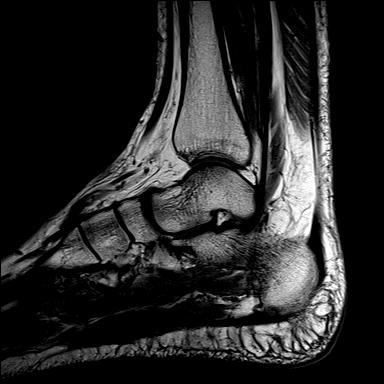
[im 14/18]
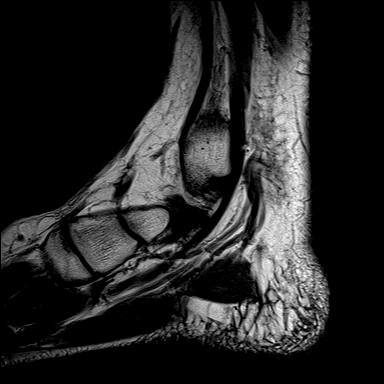
[im 18/18]
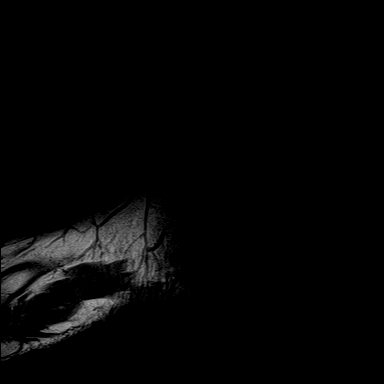

[Series 4: sagital dp fat · sagittal · left · 3.5mm · 0.66mm/px · 5 of 18 slices shown]
[im 1/18]
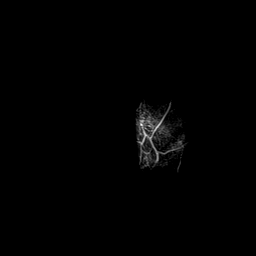
[im 5/18]
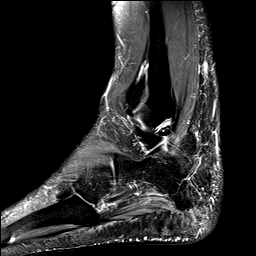
[im 9/18]
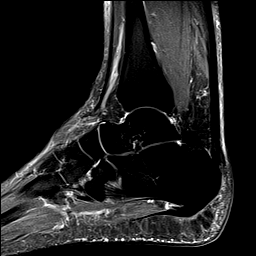
[im 13/18]
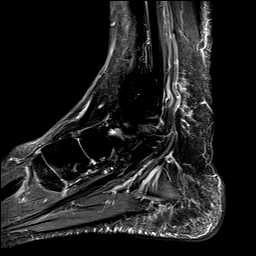
[im 18/18]
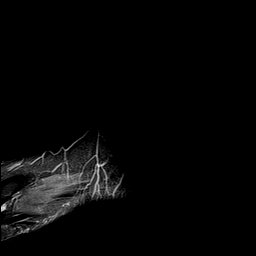

[Series 5: axial dp fat · axial · left · 3.5mm · 0.66mm/px · z∈[-64,+44]mm · 7 of 24 slices shown]
[im 1/24]
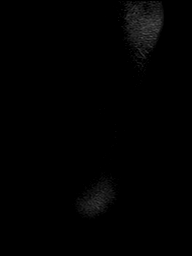
[im 4/24]
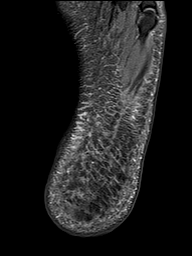
[im 8/24]
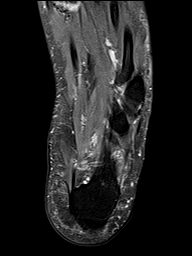
[im 12/24]
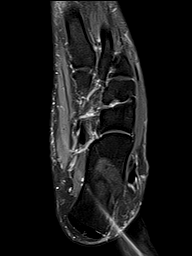
[im 16/24]
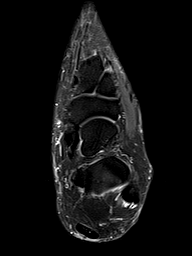
[im 20/24]
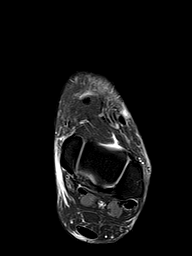
[im 24/24]
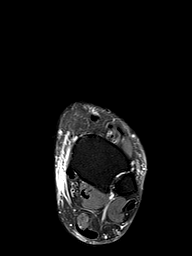

[Series 6: axial dp · axial · left · 3.5mm · 0.66mm/px · z∈[-64,+44]mm · 7 of 24 slices shown]
[im 1/24]
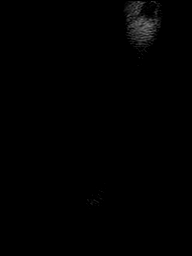
[im 4/24]
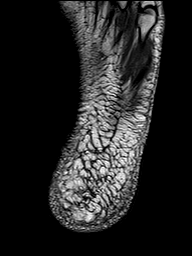
[im 8/24]
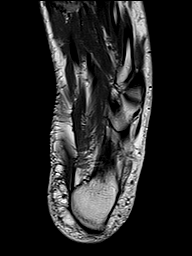
[im 12/24]
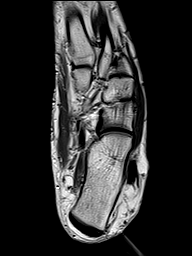
[im 16/24]
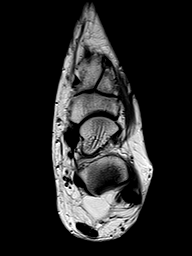
[im 20/24]
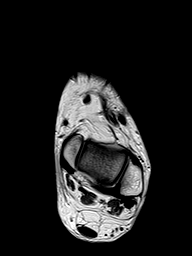
[im 24/24]
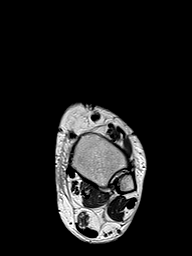

[Series 7: coronal dp fat · oblique · left · 4.0mm · 0.53mm/px · 8 of 28 slices shown (1 of 2)]
[im 1/28]
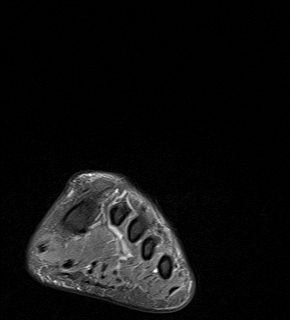
[im 4/28]
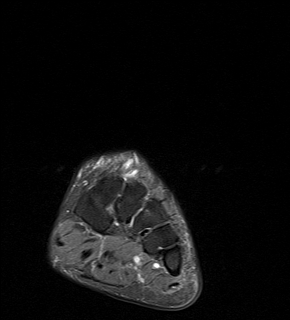
[im 8/28]
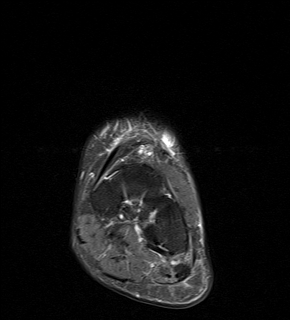
[im 12/28]
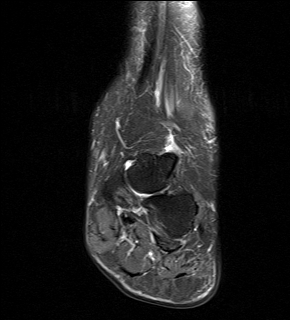
[im 16/28]
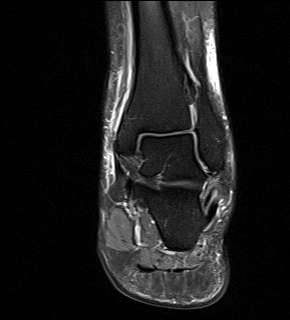
[im 20/28]
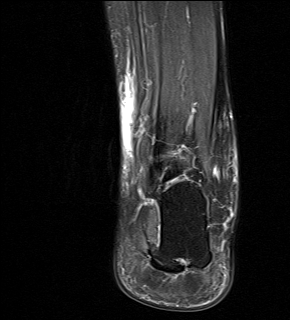
[im 24/28]
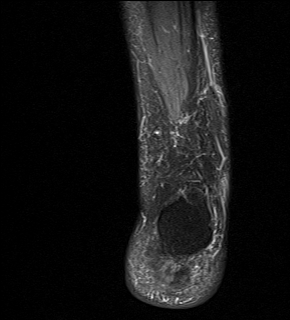
[im 28/28]
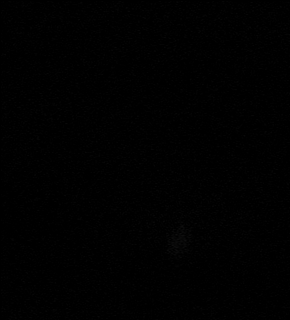

[Series 8: coronal dp fat · oblique · left · 3.5mm · 0.53mm/px · 7 of 22 slices shown (2 of 2)]
[im 1/22]
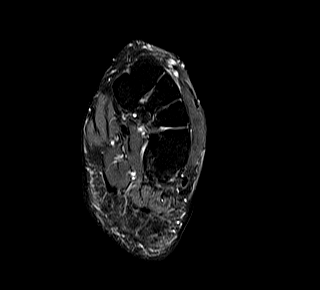
[im 4/22]
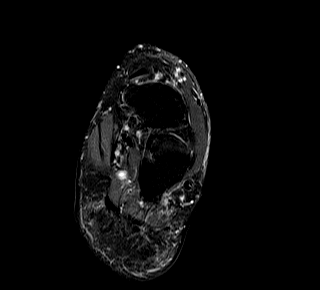
[im 8/22]
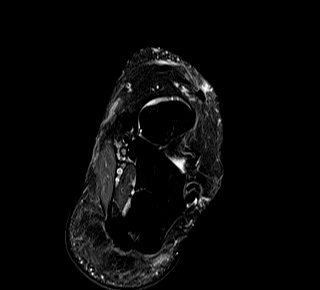
[im 11/22]
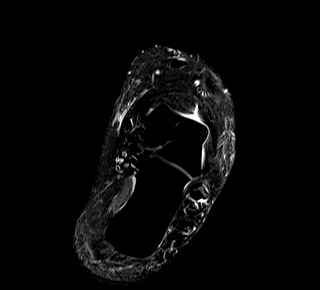
[im 15/22]
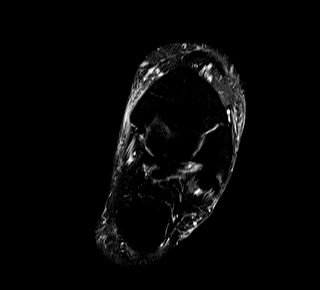
[im 18/22]
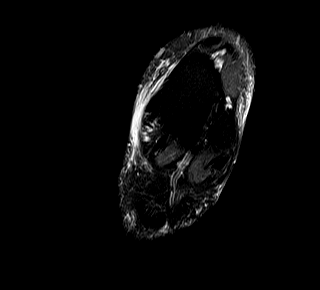
[im 22/22]
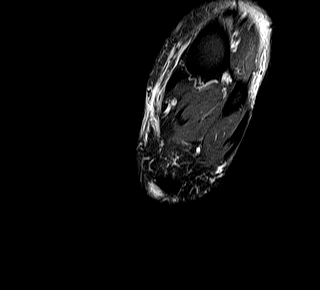

[40 of 40 positions shown; findings below may reference images not displayed]

Técnica:
Foram realizadas sequências multiplanares com ponderações em T1 e T2/DP, com e sem supressão do sinal
da gordura.
Relatório:
Indicação clínica: Tendinite dos fibulares.
RESSONÂNCIA MAGNÉTICA DO TORNOZEL[facility] ESQUERDO
Leve edema subcutâneo em região perimaleolar medial.
Ausência de derrame articular significativo.
Espaços e superfícies articulares tíbio-talar, subtalares, talonavicular, calcâneo-cubóide e demais intertársicas
preservados.
Ligamentos dos complexos medial e lateral com trajeto, espessura e sinal normais.
Tenossinovite e ruptura parcial longitudinal retro / inframaleolar do fibular curto, do tipo "split", caracterizada
por espessamento e alteração do sinal intra-substancial, com interposição parcial do tendão fibular longo.
Leve tenossinovite inframaleolar do fibular longo, que apresenta espessamento leve alteração do sinal intra-
substancial, com mínima lâmina de líquido na bainha sinovial.
Tendões flexores do compartimento medial e extensores dorsais com espessura e sinal normais.
Leve fasciíte plantar insercional proximal, apresentando leve espessamento e alteração do sinal
intrassubstancial, sem rupturas, com mínimo edema nos planos teciduais adjacentes. Pequeno esporão ósseo
plantar no calcâneo.
Tendão calcaneano com espessura e sinal preservados.
Estruturas ósseas íntegras, com morfologia e sinal normais.
Seio do tarso e túnel do tarso livres.
Leve redução volumétrica e substituição gordurosa parcial do ventre do músculo abdutor do quinto dedo,
configurando neuropatia de Baxter.
Demais estruturas tendíneas e grupos musculares visibilizados sem alterações.
Coxim adiposo plantar com aspecto preservado.
Impressão:
Tenossinovite e ruptura parcial longitudinal retro / inframaleolar do fibular curto.
Leve tenossinovite inframaleolar do fibular longo.
Leve fasciíte plantar insercional proximal, sem rupturas, com mínimo edema nos planos teciduais adjacentes.
Pequeno esporão ósseo plantar no calcâneo.
Leve redução volumétrica e substituição gordurosa parcial do ventre do músculo abdutor do quinto dedo,
configurando neuropatia de Baxter.
